# Patient Record
Sex: Male | Born: 1955
Health system: Southern US, Community
[De-identification: ages and names within clinical notes are randomized; demographics above are authoritative.]

## PROBLEM LIST (undated history)

## (undated) DIAGNOSIS — R7303 Prediabetes: Secondary | ICD-10-CM

## (undated) DIAGNOSIS — I10 Essential (primary) hypertension: Secondary | ICD-10-CM

## (undated) DIAGNOSIS — E119 Type 2 diabetes mellitus without complications: Secondary | ICD-10-CM

## (undated) DIAGNOSIS — N529 Male erectile dysfunction, unspecified: Secondary | ICD-10-CM

## (undated) DIAGNOSIS — E785 Hyperlipidemia, unspecified: Secondary | ICD-10-CM

## (undated) HISTORY — PX: APPENDECTOMY: SHX54

## (undated) HISTORY — PX: NASAL POLYP SURGERY: SHX186

## (undated) HISTORY — DX: Male erectile dysfunction, unspecified: N52.9

## (undated) HISTORY — DX: Essential (primary) hypertension: I10

## (undated) HISTORY — DX: Type 2 diabetes mellitus without complications: E11.9

## (undated) HISTORY — DX: Hyperlipidemia, unspecified: E78.5

## (undated) HISTORY — DX: Prediabetes: R73.03

---

## 2005-02-01 ENCOUNTER — Ambulatory Visit: Payer: Self-pay | Admitting: Internal Medicine

## 2005-02-25 ENCOUNTER — Ambulatory Visit: Payer: Self-pay | Admitting: Internal Medicine

## 2005-04-08 ENCOUNTER — Ambulatory Visit: Payer: Self-pay | Admitting: Internal Medicine

## 2005-06-08 ENCOUNTER — Ambulatory Visit: Payer: Self-pay | Admitting: Internal Medicine

## 2005-08-30 ENCOUNTER — Ambulatory Visit: Payer: Self-pay | Admitting: Internal Medicine

## 2005-09-27 ENCOUNTER — Ambulatory Visit: Payer: Self-pay | Admitting: Internal Medicine

## 2005-10-01 ENCOUNTER — Ambulatory Visit: Payer: Self-pay | Admitting: Internal Medicine

## 2005-10-28 ENCOUNTER — Ambulatory Visit: Payer: Self-pay | Admitting: Internal Medicine

## 2005-12-10 ENCOUNTER — Ambulatory Visit: Payer: Self-pay | Admitting: Internal Medicine

## 2005-12-21 ENCOUNTER — Ambulatory Visit: Payer: Self-pay | Admitting: Internal Medicine

## 2006-02-02 ENCOUNTER — Ambulatory Visit: Payer: Self-pay | Admitting: Internal Medicine

## 2006-02-02 LAB — CONVERTED CEMR LAB
CO2: 29 meq/L (ref 19–32)
Chol/HDL Ratio, serum: 3.9
Cholesterol: 175 mg/dL (ref 0–200)
Creatinine, Ser: 0.9 mg/dL (ref 0.4–1.5)
Glomerular Filtration Rate, Af Am: 116 mL/min/{1.73_m2}
Glucose, Bld: 109 mg/dL — ABNORMAL HIGH (ref 70–99)
Potassium: 4.3 meq/L (ref 3.5–5.1)
Triglyceride fasting, serum: 150 mg/dL — ABNORMAL HIGH (ref 0–149)

## 2006-02-03 ENCOUNTER — Ambulatory Visit: Payer: Self-pay | Admitting: Internal Medicine

## 2006-05-19 ENCOUNTER — Ambulatory Visit: Payer: Self-pay | Admitting: Internal Medicine

## 2006-09-01 ENCOUNTER — Ambulatory Visit: Payer: Self-pay | Admitting: Internal Medicine

## 2006-09-01 LAB — CONVERTED CEMR LAB
Albumin: 3.9 g/dL (ref 3.5–5.2)
Alkaline Phosphatase: 61 units/L (ref 39–117)
BUN: 14 mg/dL (ref 6–23)
Chloride: 107 meq/L (ref 96–112)
Creatinine, Ser: 0.8 mg/dL (ref 0.4–1.5)
GFR calc non Af Amer: 109 mL/min
LDL Cholesterol: 126 mg/dL — ABNORMAL HIGH (ref 0–99)
Potassium: 4.1 meq/L (ref 3.5–5.1)
Total Bilirubin: 0.7 mg/dL (ref 0.3–1.2)
Triglycerides: 159 mg/dL — ABNORMAL HIGH (ref 0–149)
VLDL: 32 mg/dL (ref 0–40)

## 2006-09-22 ENCOUNTER — Ambulatory Visit: Payer: Self-pay | Admitting: Internal Medicine

## 2006-10-01 DIAGNOSIS — J309 Allergic rhinitis, unspecified: Secondary | ICD-10-CM | POA: Insufficient documentation

## 2006-10-01 DIAGNOSIS — Z87891 Personal history of nicotine dependence: Secondary | ICD-10-CM | POA: Insufficient documentation

## 2006-10-01 DIAGNOSIS — E785 Hyperlipidemia, unspecified: Secondary | ICD-10-CM | POA: Insufficient documentation

## 2006-10-01 DIAGNOSIS — F17200 Nicotine dependence, unspecified, uncomplicated: Secondary | ICD-10-CM | POA: Insufficient documentation

## 2006-10-01 DIAGNOSIS — R7309 Other abnormal glucose: Secondary | ICD-10-CM

## 2006-12-08 ENCOUNTER — Encounter: Payer: Self-pay | Admitting: Internal Medicine

## 2006-12-08 ENCOUNTER — Ambulatory Visit: Payer: Self-pay | Admitting: Internal Medicine

## 2006-12-08 DIAGNOSIS — I10 Essential (primary) hypertension: Secondary | ICD-10-CM

## 2007-02-17 ENCOUNTER — Ambulatory Visit: Payer: Self-pay | Admitting: Internal Medicine

## 2007-02-17 LAB — CONVERTED CEMR LAB
ALT: 38 U/L
AST: 28 U/L
BUN: 12 mg/dL
CO2: 28 meq/L
Calcium: 9.2 mg/dL
Chloride: 103 meq/L
Cholesterol: 182 mg/dL
Creatinine, Ser: 0.8 mg/dL
GFR calc Af Amer: 132 mL/min
GFR calc non Af Amer: 109 mL/min
Glucose, Bld: 98 mg/dL
HDL: 44.6 mg/dL
Hgb A1c MFr Bld: 6.1 % — ABNORMAL HIGH
LDL Cholesterol: 98 mg/dL
Potassium: 3.9 meq/L
Sodium: 138 meq/L
Total CHOL/HDL Ratio: 4.1
Triglycerides: 197 mg/dL — ABNORMAL HIGH
VLDL: 39 mg/dL

## 2007-02-23 ENCOUNTER — Ambulatory Visit: Payer: Self-pay | Admitting: Internal Medicine

## 2007-02-23 DIAGNOSIS — F528 Other sexual dysfunction not due to a substance or known physiological condition: Secondary | ICD-10-CM | POA: Insufficient documentation

## 2007-02-23 DIAGNOSIS — N529 Male erectile dysfunction, unspecified: Secondary | ICD-10-CM | POA: Insufficient documentation

## 2007-06-09 ENCOUNTER — Ambulatory Visit: Payer: Self-pay | Admitting: Internal Medicine

## 2007-06-09 LAB — CONVERTED CEMR LAB
Cholesterol: 159 mg/dL (ref 0–200)
LDL Cholesterol: 96 mg/dL (ref 0–99)
Total CHOL/HDL Ratio: 3.6
VLDL: 19 mg/dL (ref 0–40)

## 2007-06-20 ENCOUNTER — Encounter: Payer: Self-pay | Admitting: Internal Medicine

## 2007-06-23 ENCOUNTER — Ambulatory Visit: Payer: Self-pay | Admitting: Internal Medicine

## 2007-07-28 ENCOUNTER — Ambulatory Visit: Payer: Self-pay | Admitting: Gastroenterology

## 2007-08-11 ENCOUNTER — Ambulatory Visit: Payer: Self-pay | Admitting: Gastroenterology

## 2007-08-11 HISTORY — PX: COLONOSCOPY: SHX174

## 2008-01-02 ENCOUNTER — Ambulatory Visit: Payer: Self-pay | Admitting: Internal Medicine

## 2008-01-02 DIAGNOSIS — K5730 Diverticulosis of large intestine without perforation or abscess without bleeding: Secondary | ICD-10-CM | POA: Insufficient documentation

## 2008-01-05 ENCOUNTER — Ambulatory Visit: Payer: Self-pay | Admitting: Internal Medicine

## 2008-01-05 LAB — CONVERTED CEMR LAB
BUN: 19 mg/dL (ref 6–23)
CO2: 27 meq/L (ref 19–32)
Calcium: 9 mg/dL (ref 8.4–10.5)
Cholesterol: 209 mg/dL (ref 0–200)
Direct LDL: 120.8 mg/dL
GFR calc Af Amer: 154 mL/min
Glucose, Bld: 94 mg/dL (ref 70–99)
HDL: 50.7 mg/dL (ref >39.0)
Sodium: 138 meq/L (ref 135–145)
TSH: 1.04 microintl units/mL (ref 0.35–5.50)
Triglycerides: 124 mg/dL (ref 0–149)

## 2008-06-28 ENCOUNTER — Ambulatory Visit: Payer: Self-pay | Admitting: Internal Medicine

## 2008-06-28 DIAGNOSIS — J069 Acute upper respiratory infection, unspecified: Secondary | ICD-10-CM | POA: Insufficient documentation

## 2008-07-12 ENCOUNTER — Ambulatory Visit: Payer: Self-pay | Admitting: Internal Medicine

## 2008-07-12 LAB — CONVERTED CEMR LAB
AST: 22 units/L (ref 0–37)
Alkaline Phosphatase: 59 units/L (ref 39–117)
Basophils Absolute: 0 10*3/uL (ref 0.0–0.1)
Bilirubin, Direct: 0.2 mg/dL (ref 0.0–0.3)
CO2: 27 meq/L (ref 19–32)
Calcium: 9.1 mg/dL (ref 8.4–10.5)
Eosinophils Relative: 3.6 % (ref 0.0–5.0)
GFR calc non Af Amer: 152.73 mL/min (ref >60)
Glucose, Bld: 92 mg/dL (ref 70–99)
HDL: 44.8 mg/dL (ref >39.00)
Hgb A1c MFr Bld: 6.3 % (ref 4.6–6.5)
Lymphocytes Relative: 44.2 % (ref 12.0–46.0)
Monocytes Relative: 9.5 % (ref 3.0–12.0)
PSA: 0.29 ng/mL (ref 0.10–4.00)
Platelets: 265 10*3/uL (ref 150.0–400.0)
Potassium: 3.8 meq/L (ref 3.5–5.1)
RDW: 13.2 % (ref 11.5–14.6)
Sodium: 142 meq/L (ref 135–145)
TSH: 0.83 microintl units/mL (ref 0.35–5.50)
Total CHOL/HDL Ratio: 4
Total Protein: 7 g/dL (ref 6.0–8.3)
VLDL: 17.8 mg/dL (ref 0.0–40.0)
WBC: 4.3 10*3/uL — ABNORMAL LOW (ref 4.5–10.5)

## 2008-07-14 ENCOUNTER — Encounter: Payer: Self-pay | Admitting: Internal Medicine

## 2008-08-13 ENCOUNTER — Encounter: Payer: Self-pay | Admitting: Internal Medicine

## 2008-09-16 ENCOUNTER — Ambulatory Visit: Payer: Self-pay | Admitting: Family Medicine

## 2008-09-16 ENCOUNTER — Ambulatory Visit: Payer: Self-pay | Admitting: Diagnostic Radiology

## 2008-09-16 ENCOUNTER — Ambulatory Visit (HOSPITAL_BASED_OUTPATIENT_CLINIC_OR_DEPARTMENT_OTHER): Admission: RE | Admit: 2008-09-16 | Discharge: 2008-09-16 | Payer: Self-pay | Admitting: Family Medicine

## 2008-09-16 DIAGNOSIS — R071 Chest pain on breathing: Secondary | ICD-10-CM

## 2008-12-27 ENCOUNTER — Ambulatory Visit: Payer: Self-pay | Admitting: Internal Medicine

## 2008-12-27 LAB — CONVERTED CEMR LAB
Hgb A1c MFr Bld: 5.9 % (ref 4.6–6.1)
Potassium: 4.7 meq/L (ref 3.5–5.3)
Sodium: 140 meq/L (ref 135–145)

## 2008-12-30 ENCOUNTER — Encounter: Payer: Self-pay | Admitting: Internal Medicine

## 2009-04-24 ENCOUNTER — Emergency Department (HOSPITAL_COMMUNITY): Admission: EM | Admit: 2009-04-24 | Discharge: 2009-04-24 | Payer: Self-pay | Admitting: Emergency Medicine

## 2009-06-06 ENCOUNTER — Encounter (INDEPENDENT_AMBULATORY_CARE_PROVIDER_SITE_OTHER): Payer: Self-pay | Admitting: *Deleted

## 2009-06-06 ENCOUNTER — Ambulatory Visit: Payer: Self-pay | Admitting: Internal Medicine

## 2009-06-06 DIAGNOSIS — T2009XA Burn of unspecified degree of multiple sites of head, face, and neck, initial encounter: Secondary | ICD-10-CM

## 2009-06-06 HISTORY — DX: Burn of unspecified degree of multiple sites of head, face, and neck, initial encounter: T20.09XA

## 2009-06-06 LAB — CONVERTED CEMR LAB
CO2: 24 meq/L (ref 19–32)
Calcium: 9.3 mg/dL (ref 8.4–10.5)
Chloride: 102 meq/L (ref 96–112)
Potassium: 4.1 meq/L (ref 3.5–5.3)
Sodium: 140 meq/L (ref 135–145)

## 2009-06-09 ENCOUNTER — Encounter: Payer: Self-pay | Admitting: Internal Medicine

## 2009-08-11 ENCOUNTER — Telehealth: Payer: Self-pay | Admitting: Internal Medicine

## 2010-02-24 ENCOUNTER — Ambulatory Visit: Payer: Self-pay | Admitting: Internal Medicine

## 2010-02-24 ENCOUNTER — Encounter: Payer: Self-pay | Admitting: Internal Medicine

## 2010-02-24 DIAGNOSIS — R350 Frequency of micturition: Secondary | ICD-10-CM

## 2010-02-24 LAB — CONVERTED CEMR LAB
ALT: 56 units/L — ABNORMAL HIGH (ref 0–53)
Bilirubin Urine: NEGATIVE
Bilirubin, Direct: 0.1 mg/dL (ref 0.0–0.3)
Chloride: 106 meq/L (ref 96–112)
Cholesterol: 185 mg/dL (ref 0–200)
Glucose, Bld: 97 mg/dL (ref 70–99)
Hgb A1c MFr Bld: 5.8 % — ABNORMAL HIGH (ref <5.7)
Ketones, urine, test strip: NEGATIVE
Potassium: 4.6 meq/L (ref 3.5–5.3)
Sodium: 142 meq/L (ref 135–145)
TSH: 1.006 microintl units/mL (ref 0.350–4.500)
Total CHOL/HDL Ratio: 3.5
Total Protein: 7.1 g/dL (ref 6.0–8.3)
Triglycerides: 430 mg/dL — ABNORMAL HIGH (ref <150)
Urobilinogen, UA: 0.2

## 2010-03-09 ENCOUNTER — Encounter: Payer: Self-pay | Admitting: Internal Medicine

## 2010-03-11 ENCOUNTER — Telehealth: Payer: Self-pay | Admitting: Internal Medicine

## 2010-04-09 NOTE — Progress Notes (Signed)
Summary: Micardis Refill  Phone Note Refill Request Message from:  Fax from Pharmacy on August 11, 2009 12:16 PM  Refills Requested: Medication #1:  MICARDIS 40 MG  TABS one by mouth once daily   Dosage confirmed as above?Dosage Confirmed   Brand Name Necessary? No   Supply Requested: 3 months   Last Refilled: 05/06/2009  Method Requested: Electronic Next Appointment Scheduled: None Initial call taken by: Glendell Docker CMA,  August 11, 2009 12:17 PM  Follow-up for Phone Call        Rx completed in Dr. Tiajuana Amass Follow-up by: Glendell Docker CMA,  August 11, 2009 12:17 PM    Prescriptions: MICARDIS 40 MG  TABS (TELMISARTAN) one by mouth once daily  #90 x 3   Entered by:   Glendell Docker CMA   Authorized by:   D. Thomos Lemons DO   Signed by:   Glendell Docker CMA on 08/11/2009   Method used:   [facility]        [facility] [phone]* (retail)       [address]       [place], [place]  [address]       Ph: [phone] or [phone]       Fax: [phone]   RxID:   [id number]
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
## 2010-04-09 NOTE — Letter (Signed)
Summary: Primary Care Consult Scheduled Letter  [place] at [facility]  [address]   [place], [place] [address]   Phone: [phone]  Fax: [phone]      06/06/2009 MRN: [id number]  Drew Harris [address] [place], [place]  [address]    Dear [name],      We have scheduled an appointment for you.  At the recommendation of Dr._YOO, we have scheduled you a consult with _DR Donzetta Starch @ [facility]  on _MAY 23,2011 at [facility] .  Their address is_[address] , [place] . The office phone number is [phone].  If this appointment day and time is not convenient for you, please feel free to call the office of the doctor you are being referred to at the number listed above and reschedule the appointment.     It is important for you to keep your scheduled appointments. We are here to make sure you are given good patient care. If you have questions or you have made changes to your appointment, please notify us at  [phone], ask for  HELEN.    Thank you,  Patient Care Coordinator [place] at [facility]

## 2010-04-09 NOTE — Letter (Signed)
   [place] at [facility] [address] [place], [place]  [address]  [place] Phone: [phone]      March 09, 2010   Ragnar Sabree [address] [name], [place] [address]  RE:  LAB RESULTS  Dear  Mr. Kozub,  The following is an interpretation of your most recent lab tests.  Please take note of any instructions provided or changes to medications that have resulted from your lab work.  ELECTROLYTES:  Good - no changes needed  KIDNEY FUNCTION TESTS:  Good - no changes needed  LIVER FUNCTION TESTS:  Stable - no changes needed  LIPID PANEL:  Fair - review at your next visit Triglyceride: 430   Cholesterol: 185   LDL: See Comment mg/dL   HDL: 53   Chol/HDL%:  3.5 Ratio  THYROID STUDIES:  Thyroid studies normal TSH: 1.006     DIABETIC STUDIES:  Good - no changes needed Blood Glucose: 97   HgbA1C: 5.8          Sincerely Yours,    Dr. Thomos Lemons  Appended Document:  mailed

## 2010-04-09 NOTE — Progress Notes (Signed)
Summary: refill--benicar  Phone Note From Pharmacy   [facility] [phone]* Call For: Dr Artist Pais  Summary of Call: [facility] told them he gave them his written rx for benicar but the do not have it. Authorized Benicar 20mg  1 once daily #90 x 1 refill per 02/24/10 office note. Nicki Guadalajara Fergerson CMA (AAMA)  March 11, 2010 2:02 PM     Prescriptions: BENICAR 20 MG TABS (OLMESARTAN MEDOXOMIL) one by mouth once daily  #90 x 1   Entered by:   Mervin Kung CMA (AAMA)   Authorized by:   D. Thomos Lemons DO   Signed by:   Mervin Kung CMA (AAMA) on 03/11/2010   Method used:   Telephoned to ...       [facility] [place] [phone]* (retail)       [phone]       [place], [place]  [address]       Ph: [phone] or [phone]       Fax: [phone]   RxID:   [id number]

## 2010-04-09 NOTE — Assessment & Plan Note (Signed)
Summary: 6 MONTH FOLLOW UP/MHF   Vital Signs:  Patient profile:   55 year old male Height:      69 inches Weight:      230.25 pounds BMI:     34.12 O2 Sat:      99 % on Room air Temp:     98.3 degrees F oral Pulse rate:   93 / minute Resp:     18 per minute BP sitting:   130 / 80  (right arm) Cuff size:   large  Vitals Entered By: Glendell Docker CMA (February 24, 2010 10:09 AM)  O2 Flow:  Room air CC: 6 Month Follow up Is Patient Diabetic? No Pain Assessment Patient in pain? no      Comments urinary frequency    Primary Care Provider:  Dondra Spry DO  CC:  6 Month Follow up.  History of Present Illness: 55 y/o AA male for f/u re:  hypertension and borderline DM II doing well c/o urinary freq.  no other assoc symptoms  htn - stable.  no dizziness  DM II borderline - wt stable.  not exercising on regular basis  Preventive Screening-Counseling & Management  Alcohol-Tobacco     Smoking Status: current  Allergies: 1)  ! Ace Inhibitors  Past History:  Past Medical History: Allergic rhinitis Hyperlipidemia  Borderline type 2 diabetes    Hypertension Erectile Dysfunction    Past Surgical History: Appendectomy        Family History: Mother are 16 years old with hypertension Father deceased age 75 secondary to prostate cancer Sister has type 2 diabetes       Social History: Quit Tobacco Single  Alcohol use-yes     Occupation:  [employer]    Review of Systems  The patient denies fever and weight gain.         not exercising on regular basis   Physical Exam  General:  alert and overweight-appearing.   Head:  normocephalic and atraumatic.   Neck:  no carotid bruits.   Lungs:  normal respiratory effort, normal breath sounds, no crackles, and no wheezes.   Heart:  normal rate, regular rhythm, no murmur, and no gallop.   Abdomen:  soft, non-tender, no masses, no hepatomegaly, and no splenomegaly.   Extremities:  trace left pedal edema  and trace right pedal edema.   Neurologic:  cranial nerves II-XII intact and gait normal.   Psych:  normally interactive, good eye contact, not anxious appearing, and not depressed appearing.     Impression & Recommendations:  Problem # 1:  URINARY FREQUENCY (ICD-788.41) no dysuria no back pain or fever  UA shows trace blood  Increase fluids. repeat u/a in March pt advised to call office if persistent urinary complaints  Problem # 2:  ESSENTIAL HYPERTENSION (ICD-401.9)  His updated medication list for this problem includes:    Benicar 20 Mg Tabs (Olmesartan medoxomil) ..... One by mouth once daily    Amlodipine Besylate 10 Mg Tabs (Amlodipine besylate) .Marland Kitchen... Take 1 tablet by mouth once a day  Orders: T-Basic Metabolic Panel [id number])  Problem # 3:  ABNORMAL GLUCOSE NEC (ICD-790.29) pt stopped exercising regularly.  monitor A1c.  Pt strongly encouraged to resume regular exercise  His updated medication list for this problem includes:    Metformin Hcl 500 Mg Xr24h-tab (Metformin hcl) .Marland Kitchen... 2 tabs by mouth bid  Orders: T- Hemoglobin A1C ([id number])  Complete Medication List: 1)  Benicar 20 Mg Tabs (Olmesartan medoxomil) .... One  by mouth once daily 2)  Metformin Hcl 500 Mg Xr24h-tab (Metformin hcl) .... 2 tabs by mouth bid 3)  Levitra 10 Mg Tabs (Vardenafil hcl) .... By mouth as needed 4)  Amlodipine Besylate 10 Mg Tabs (Amlodipine besylate) .... Take 1 tablet by mouth once a day 5)  Simvastatin 20 Mg Tabs (Simvastatin) .... One by mouth qpm 6)  Cialis 5 Mg Tabs (Tadalafil) .... One by mouth once daily  Other Orders: T-Hepatic Function [id number]) T-Lipid Profile [id number]) T-TSH [id number]) UA Dipstick w/o Micro (manual) (62952) Specimen Handling (99000) T-Culture, Urine ([id number])  Patient Instructions: 1)  Please schedule a follow-up appointment in 6 months. 2)  Resume regular exercise program 3)  Goal weight loss 10-15 lbs before next office  visit. 4)  Return in March, 2012 to drop off urine sample. Prescriptions: METFORMIN HCL 500 MG XR24H-TAB (METFORMIN HCL) 2 tabs by mouth bid  #360 x 1   Entered and Authorized by:   D. Thomos Lemons DO   Signed by:   D. Thomos Lemons DO on 02/24/2010   Method used:   Print then Give to Patient   RxID:   [id number] SIMVASTATIN 20 MG TABS (SIMVASTATIN) one by mouth qpm  #90 x 3   Entered and Authorized by:   D. Thomos Lemons DO   Signed by:   D. Thomos Lemons DO on 02/24/2010   Method used:   Print then Give to Patient   RxID:   [id number] AMLODIPINE BESYLATE 10 MG  TABS (AMLODIPINE BESYLATE) Take 1 tablet by mouth once a day  #90 x 3   Entered and Authorized by:   D. Thomos Lemons DO   Signed by:   D. Thomos Lemons DO on 02/24/2010   Method used:   Print then Give to Patient   RxID:   [id number] BENICAR 20 MG TABS (OLMESARTAN MEDOXOMIL) one by mouth once daily  #90 x 1   Entered and Authorized by:   D. Thomos Lemons DO   Signed by:   D. Thomos Lemons DO on 02/24/2010   Method used:   Print then Give to Patient   RxID:   [id number] CIALIS 5 MG TABS (TADALAFIL) one by mouth once daily  #30 x 0   Entered and Authorized by:   D. Thomos Lemons DO   Signed by:   D. Thomos Lemons DO on 02/24/2010   Method used:   Print then Give to Patient   RxID:   [id number]    Orders Added: 1)  T-Basic Metabolic Panel [[id number]] 2)  T- Hemoglobin A1C [83036-23375] 3)  T-Hepatic Function [80076-22960] 4)  T-Lipid Profile [80061-22930] 5)  T-TSH [37106-26948] 6)  UA Dipstick w/o Micro (manual) [81002] 7)  Specimen Handling [99000] 8)  T-Culture, Urine [[id number]] 9)  Est. Patient Level IV [93818]   Immunization History:  Influenza Immunization History:    Influenza:  declined (02/24/2010)   Contraindications/Deferment of Procedures/Staging:    Test/Procedure: FLU VAX    Reason for deferment: patient declined   Immunization History:  Influenza Immunization History:    Influenza:   Declined (02/24/2010)   Current Allergies (reviewed today): ! ACE INHIBITORS   Laboratory Results   Urine Tests   Date/Time Reported: Mervin Kung CMA Duncan Dull)  February 24, 2010 10:42 AM   Routine Urinalysis   Color: yellow Appearance: Clear Glucose: negative   (Normal Range: Negative) Bilirubin: negative   (Normal Range: Negative) Ketone: negative   (Normal Range: Negative) Spec. Gravity: 1.010   (Normal Range: 1.003-1.035)  Blood: trace-intact   (Normal Range: Negative) pH: 7.0   (Normal Range: 5.0-8.0) Protein: trace   (Normal Range: Negative) Urobilinogen: 0.2   (Normal Range: 0-1) Nitrite: negative   (Normal Range: Negative) Leukocyte Esterace: negative   (Normal Range: Negative)    Comments: Urine cultured. Nicki Guadalajara Fergerson CMA [facility] Dull)  February 24, 2010 10:42 AM

## 2010-04-09 NOTE — Letter (Signed)
   [place] at [facility] [address] [place], [place]  [address]  [place] Phone: [phone]      June 09, 2009   Drew Harris [address] [place], [place] [address]  RE:  LAB RESULTS  Dear  [name],  The following is an interpretation of your most recent lab tests.  Please take note of any instructions provided or changes to medications that have resulted from your lab work.  ELECTROLYTES:  Good - no changes needed    DIABETIC STUDIES:  Good - no changes needed Blood Glucose: 104   HgbA1C: 5.9          Sincerely Yours,    Dr. Thomos Lemons

## 2010-05-19 ENCOUNTER — Ambulatory Visit (INDEPENDENT_AMBULATORY_CARE_PROVIDER_SITE_OTHER): Payer: Managed Care, Other (non HMO) | Admitting: Internal Medicine

## 2010-05-19 ENCOUNTER — Encounter: Payer: Self-pay | Admitting: Internal Medicine

## 2010-05-19 DIAGNOSIS — R7309 Other abnormal glucose: Secondary | ICD-10-CM

## 2010-05-19 DIAGNOSIS — I10 Essential (primary) hypertension: Secondary | ICD-10-CM

## 2010-05-22 ENCOUNTER — Ambulatory Visit: Payer: Self-pay | Admitting: Internal Medicine

## 2010-06-09 NOTE — Assessment & Plan Note (Signed)
Summary: 3 month follow up/mhf   Vital Signs:  Patient profile:   55 year old male Height:      69 inches Weight:      231.75 pounds BMI:     34.35 O2 Sat:      99 % on Room air Temp:     98.0 degrees F oral Pulse rate:   99 / minute Resp:     18 per minute BP sitting:   130 / 90  (left arm) Cuff size:   large  Vitals Entered By: Glendell Docker CMA (May 19, 2010 3:18 PM)  O2 Flow:  Room air CC: 3 month Follwo up  Is Patient Diabetic? Yes Pain Assessment Patient in pain? no        Primary Care Provider:  Dondra Spry DO  CC:  3 month Follwo up .  History of Present Illness:  Hypertension Follow-Up      This is a 55 year old man who presents for Hypertension follow-up.  The patient denies lightheadedness and headaches.  The patient denies the following associated symptoms: chest pain and chest pressure.  Compliance with medications (by patient report) has been near 100%.  The patient reports that dietary compliance has been fair.    DM II borderline - wt stable.  fair dietary compliance  Preventive Screening-Counseling & Management  Alcohol-Tobacco     Smoking Status: current  Allergies: 1)  ! Ace Inhibitors  Past History:  Past Medical History: Allergic rhinitis Hyperlipidemia  Borderline type 2 diabetes    Hypertension Erectile Dysfunction     Past Surgical History: Appendectomy         Family History: Mother are 3 years old with hypertension Father deceased age 58 secondary to prostate cancer Sister has type 2 diabetes        Social History: Quit Tobacco Single   Alcohol use-yes     Occupation:  [employer]    Physical Exam  General:  alert, well-developed, and well-nourished.   Neck:  no carotid bruits.   Lungs:  normal respiratory effort, normal breath sounds, no crackles, and no wheezes.   Heart:  normal rate, regular rhythm, no murmur, and no gallop.   Extremities:  trace left pedal edema and trace right pedal edema.      Impression & Recommendations:  Problem # 1:  ESSENTIAL HYPERTENSION (ICD-401.9) Assessment Unchanged  His updated medication list for this problem includes:    Benicar 20 Mg Tabs (Olmesartan medoxomil) ..... One by mouth once daily    Amlodipine Besylate 10 Mg Tabs (Amlodipine besylate) .Marland Kitchen... Take 1 tablet by mouth once a day  Orders: T-Basic Metabolic Panel ([id number])  BP today: 130/90 Prior BP: 130/80 (02/24/2010)  Labs Reviewed: K+: 4.6 (02/24/2010) Creat: : 0.95 (02/24/2010)   Chol: 185 (02/24/2010)   HDL: 53 (02/24/2010)   LDL: See Comment mg/dL (78/29/5621)   TG: 308 (02/24/2010)  Problem # 2:  ABNORMAL GLUCOSE NEC (ICD-790.29) Assessment: Unchanged  His updated medication list for this problem includes:    Metformin Hcl 500 Mg Xr24h-tab (Metformin hcl) .Marland Kitchen... 2 tabs by mouth bid  Orders: T- Hemoglobin A1C ([id number])  Labs Reviewed: Creat: 0.95 (02/24/2010)     Complete Medication List: 1)  Benicar 20 Mg Tabs (Olmesartan medoxomil) .... One by mouth once daily 2)  Metformin Hcl 500 Mg Xr24h-tab (Metformin hcl) .... 2 tabs by mouth bid 3)  Levitra 10 Mg Tabs (Vardenafil hcl) .... By mouth as needed 4)  Amlodipine Besylate 10  Mg Tabs (Amlodipine besylate) .... Take 1 tablet by mouth once a day 5)  Simvastatin 20 Mg Tabs (Simvastatin) .... One by mouth qpm 6)  Cialis 5 Mg Tabs (Tadalafil) .... One by mouth once daily  Other Orders: T-Lipid Profile ([id number]) T-Hepatic Function [id number])  Patient Instructions: 1)  Please schedule a follow-up appointment in 6 months (30 Mins appt) 2)  BMP prior to visit, ICD-9: 401.9 3)  PSA prior to visit, ICD-9: v76.44 4)  HbgA1C prior to visit, ICD-9: 790.29 5)  Please return for lab work one (1) week before your next appointment.    Orders Added: 1)  T-Lipid Profile [80061-22930] 2)  T- Hemoglobin A1C [83036-23375] 3)  T-Hepatic Function [80076-22960] 4)  T-Basic Metabolic Panel [80048-22910] 5)  Est.  Patient Level III [78469]     Current Allergies (reviewed today): ! ACE INHIBITORS

## 2010-06-19 ENCOUNTER — Other Ambulatory Visit (INDEPENDENT_AMBULATORY_CARE_PROVIDER_SITE_OTHER): Payer: Managed Care, Other (non HMO)

## 2010-06-19 DIAGNOSIS — E785 Hyperlipidemia, unspecified: Secondary | ICD-10-CM

## 2010-06-19 DIAGNOSIS — R7309 Other abnormal glucose: Secondary | ICD-10-CM

## 2010-06-19 LAB — HEPATIC FUNCTION PANEL
ALT: 38 U/L (ref 0–53)
AST: 32 U/L (ref 0–37)
Bilirubin, Direct: 0.1 mg/dL (ref 0.0–0.3)
Total Bilirubin: 1 mg/dL (ref 0.3–1.2)

## 2010-06-19 LAB — BASIC METABOLIC PANEL
CO2: 27 mEq/L (ref 19–32)
Calcium: 8.9 mg/dL (ref 8.4–10.5)
Creatinine, Ser: 0.7 mg/dL (ref 0.4–1.5)
Glucose, Bld: 88 mg/dL (ref 70–99)

## 2010-06-22 ENCOUNTER — Encounter: Payer: Self-pay | Admitting: Internal Medicine

## 2010-10-30 ENCOUNTER — Other Ambulatory Visit: Payer: Self-pay | Admitting: Internal Medicine

## 2010-10-30 DIAGNOSIS — R7309 Other abnormal glucose: Secondary | ICD-10-CM

## 2010-10-30 DIAGNOSIS — I10 Essential (primary) hypertension: Secondary | ICD-10-CM

## 2010-10-30 DIAGNOSIS — Z125 Encounter for screening for malignant neoplasm of prostate: Secondary | ICD-10-CM

## 2010-11-02 ENCOUNTER — Other Ambulatory Visit: Payer: Managed Care, Other (non HMO)

## 2010-11-12 ENCOUNTER — Encounter: Payer: Self-pay | Admitting: Internal Medicine

## 2010-11-13 ENCOUNTER — Ambulatory Visit: Payer: Managed Care, Other (non HMO) | Admitting: Internal Medicine

## 2010-11-13 ENCOUNTER — Encounter: Payer: Self-pay | Admitting: Internal Medicine

## 2010-11-13 ENCOUNTER — Other Ambulatory Visit: Payer: Self-pay | Admitting: Internal Medicine

## 2010-11-13 ENCOUNTER — Ambulatory Visit (INDEPENDENT_AMBULATORY_CARE_PROVIDER_SITE_OTHER): Payer: Managed Care, Other (non HMO) | Admitting: Internal Medicine

## 2010-11-13 DIAGNOSIS — I10 Essential (primary) hypertension: Secondary | ICD-10-CM

## 2010-11-13 DIAGNOSIS — E119 Type 2 diabetes mellitus without complications: Secondary | ICD-10-CM

## 2010-11-13 DIAGNOSIS — R350 Frequency of micturition: Secondary | ICD-10-CM

## 2010-11-13 DIAGNOSIS — Z125 Encounter for screening for malignant neoplasm of prostate: Secondary | ICD-10-CM

## 2010-11-13 DIAGNOSIS — E118 Type 2 diabetes mellitus with unspecified complications: Secondary | ICD-10-CM | POA: Insufficient documentation

## 2010-11-13 DIAGNOSIS — E785 Hyperlipidemia, unspecified: Secondary | ICD-10-CM

## 2010-11-13 LAB — URINALYSIS, ROUTINE W REFLEX MICROSCOPIC
Bilirubin Urine: NEGATIVE
Glucose, UA: NEGATIVE mg/dL
Protein, ur: NEGATIVE mg/dL
pH: 6 (ref 5.0–8.0)

## 2010-11-13 LAB — HEPATIC FUNCTION PANEL
ALT: 22 U/L (ref 0–53)
Bilirubin, Direct: 0.2 mg/dL (ref 0.0–0.3)
Indirect Bilirubin: 0.6 mg/dL (ref 0.0–0.9)
Total Bilirubin: 0.8 mg/dL (ref 0.3–1.2)

## 2010-11-13 LAB — BASIC METABOLIC PANEL
CO2: 25 mEq/L (ref 19–32)
Calcium: 8.9 mg/dL (ref 8.4–10.5)
Creat: 0.62 mg/dL (ref 0.50–1.35)

## 2010-11-13 LAB — CBC
MCH: 32.5 pg (ref 26.0–34.0)
MCV: 98.8 fL (ref 78.0–100.0)
Platelets: 263 10*3/uL (ref 150–400)
RDW: 13.7 % (ref 11.5–15.5)

## 2010-11-13 LAB — HEMOGLOBIN A1C: Mean Plasma Glucose: 123 mg/dL — ABNORMAL HIGH (ref <117)

## 2010-11-13 LAB — LIPID PANEL
Cholesterol: 195 mg/dL (ref 0–200)
VLDL: 33 mg/dL (ref 0–40)

## 2010-11-13 MED ORDER — METFORMIN HCL ER 500 MG PO TB24: 1000.0000 mg | ORAL_TABLET | Freq: Two times a day (BID) | ORAL | Status: DC

## 2010-11-13 MED ORDER — OLMESARTAN MEDOXOMIL 20 MG PO TABS: 20.0000 mg | ORAL_TABLET | Freq: Every day | ORAL | Status: DC

## 2010-11-13 MED ORDER — TADALAFIL 5 MG PO TABS: 5.0000 mg | ORAL_TABLET | Freq: Every day | ORAL | Status: DC | PRN

## 2010-11-13 MED ORDER — SIMVASTATIN 20 MG PO TABS: 20.0000 mg | ORAL_TABLET | Freq: Every day | ORAL | Status: DC

## 2010-11-13 MED ORDER — CIPROFLOXACIN HCL 500 MG PO TABS: 500.0000 mg | ORAL_TABLET | Freq: Two times a day (BID) | ORAL | Status: AC

## 2010-11-13 MED ORDER — AMLODIPINE BESYLATE 10 MG PO TABS: 10.0000 mg | ORAL_TABLET | Freq: Every day | ORAL | Status: DC

## 2010-11-13 NOTE — Assessment & Plan Note (Signed)
Obtain cbc, chem7, a1c 

## 2010-11-13 NOTE — Assessment & Plan Note (Signed)
Borderline results today but hasn't taken medication. Take medication daily, monitor bp and followup in clinic as scheduled.

## 2010-11-13 NOTE — Assessment & Plan Note (Signed)
Obtain lipid/lft. ldl goal <100. 

## 2010-11-13 NOTE — Progress Notes (Signed)
  Subjective:    Patient ID: Drew Harris, male    DOB: 15-Feb-1956, 55 y.o.   MRN: [id number]  HPI Pt presents to clinic for followup of multiple medical problems. Notes recent onset right groin pain with urinary urgency and frequency. Denies hematuria, dysuria or f/c. No exacerbating or alleviating factors. BP rechecked to be 138/70 and states hasn't taken medication today. Tolerates statin tx without myalgias or abn lfts. H/o mild dm tolerating metformin without gi upset. No other complaints.  Past Medical History  Diagnosis Date  . Allergic rhinitis   . Hyperlipidemia   . Borderline diabetes     Type 2  . Hypertension   . ED (erectile dysfunction)    Past Surgical History  Procedure Date  . Appendectomy     reports that he has quit smoking. He has never used smokeless tobacco. He reports that he drinks alcohol. He reports that he does not use illicit drugs. family history includes Diabetes type II in his sister; Hypertension in his mother; and Prostate cancer in his father. Allergies  Allergen Reactions  . Ace Inhibitors      Review of Systems see hpi    Objective:   Physical Exam  Physical Exam  Nursing note and vitals reviewed. Constitutional: Appears well-developed and well-nourished. No distress.  HENT:  Head: Normocephalic and atraumatic.  Right Ear: External ear normal.  Left Ear: External ear normal.  Eyes: Conjunctivae are normal. No scleral icterus.  Neck: Neck supple. Carotid bruit is not present.  Cardiovascular: Normal rate, regular rhythm and normal heart sounds.  Exam reveals no gallop and no friction rub.   No murmur heard. Pulmonary/Chest: Effort normal and breath sounds normal. No respiratory distress. He has no wheezes. no rales.  Lymphadenopathy:    He has no cervical adenopathy.  Neurological:Alert.  Skin: Skin is warm and dry. Not diaphoretic.  Psychiatric: Has a normal mood and affect.        Assessment & Plan:

## 2010-11-13 NOTE — Assessment & Plan Note (Signed)
Obtain ua. Begin cipro x 10d. Followup if no improvement or worsening.

## 2010-11-13 NOTE — Patient Instructions (Signed)
Please schedule chem7, a1c, urine microalbumin 250.0 prior to next visit 

## 2010-11-19 LAB — PSA: PSA: 0.33 ng/mL (ref <=4.00)

## 2011-02-12 ENCOUNTER — Ambulatory Visit (INDEPENDENT_AMBULATORY_CARE_PROVIDER_SITE_OTHER): Payer: Managed Care, Other (non HMO) | Admitting: Internal Medicine

## 2011-02-12 ENCOUNTER — Telehealth: Payer: Self-pay | Admitting: Internal Medicine

## 2011-02-12 ENCOUNTER — Encounter: Payer: Self-pay | Admitting: Internal Medicine

## 2011-02-12 VITALS — BP 132/90 | HR 89 | Temp 97.7°F | Resp 16 | Wt 235.0 lb

## 2011-02-12 DIAGNOSIS — Z125 Encounter for screening for malignant neoplasm of prostate: Secondary | ICD-10-CM

## 2011-02-12 DIAGNOSIS — E119 Type 2 diabetes mellitus without complications: Secondary | ICD-10-CM

## 2011-02-12 DIAGNOSIS — Z23 Encounter for immunization: Secondary | ICD-10-CM

## 2011-02-12 DIAGNOSIS — E785 Hyperlipidemia, unspecified: Secondary | ICD-10-CM

## 2011-02-12 DIAGNOSIS — I10 Essential (primary) hypertension: Secondary | ICD-10-CM

## 2011-02-12 LAB — BASIC METABOLIC PANEL
BUN: 14 mg/dL (ref 6–23)
Calcium: 9.1 mg/dL (ref 8.4–10.5)
Chloride: 103 mEq/L (ref 96–112)
Creat: 0.68 mg/dL (ref 0.50–1.35)

## 2011-02-12 LAB — LIPID PANEL
HDL: 56 mg/dL (ref >39)
LDL Cholesterol: 79 mg/dL (ref 0–99)
Total CHOL/HDL Ratio: 3.4 Ratio
Triglycerides: 281 mg/dL — ABNORMAL HIGH (ref <150)
VLDL: 56 mg/dL — ABNORMAL HIGH (ref 0–40)

## 2011-02-12 LAB — HEMOGLOBIN A1C: Mean Plasma Glucose: 120 mg/dL — ABNORMAL HIGH (ref <117)

## 2011-02-12 MED ORDER — METFORMIN HCL ER 500 MG PO TB24: 1000.0000 mg | ORAL_TABLET | Freq: Two times a day (BID) | ORAL | Status: DC

## 2011-02-12 NOTE — Assessment & Plan Note (Signed)
Obtain lipid profile. 

## 2011-02-12 NOTE — Assessment & Plan Note (Signed)
Minimally suboptimal. Defers increase of medication currently. States has resonded in the past to regular exercise.

## 2011-02-12 NOTE — Patient Instructions (Signed)
Please schedule chem7, a1c (250.0) and lipid/lft (272.4) prior to next visit 

## 2011-02-12 NOTE — Assessment & Plan Note (Signed)
Obtain chem7, a1c, urine microalbumin. Has scheduled yearly eye exam.

## 2011-02-12 NOTE — Progress Notes (Signed)
  Subjective:    Patient ID: Drew Harris, male    DOB: 05/07/1955, 55 y.o.   MRN: [id number]  HPI Pt presents to clinic for followup of multiple medical problems. BP minimally elevated. Compliant with medication without adverse effect. Diabetic eye exam pending. Denies feet parethesia. No active complaints.  Past Medical History  Diagnosis Date  . Allergic rhinitis   . Hyperlipidemia   . Borderline diabetes     Type 2  . Hypertension   . ED (erectile dysfunction)    Past Surgical History  Procedure Date  . Appendectomy     reports that he has quit smoking. He has never used smokeless tobacco. He reports that he drinks alcohol. He reports that he does not use illicit drugs. family history includes Diabetes type II in his sister; Hypertension in his mother; and Prostate cancer in his father. Allergies  Allergen Reactions  . Ace Inhibitors      Review of Systems see hpi     Objective:   Physical Exam  Physical Exam  Nursing note and vitals reviewed. Constitutional: Appears well-developed and well-nourished. No distress.  HENT:  Head: Normocephalic and atraumatic.  Right Ear: External ear normal.  Left Ear: External ear normal.  Eyes: Conjunctivae are normal. No scleral icterus.  Neck: Neck supple. Carotid bruit is not present.  Cardiovascular: Normal rate, regular rhythm and normal heart sounds.  Exam reveals no gallop and no friction rub.   No murmur heard. Pulmonary/Chest: Effort normal and breath sounds normal. No respiratory distress. He has no wheezes. no rales.  Lymphadenopathy:    He has no cervical adenopathy.  Neurological:Alert.  Skin: Skin is warm and dry. Not diaphoretic.  Psychiatric: Has a normal mood and affect.   Diabetic foot exam: +2 DP pulses, no diabetic wounds, ulcerations or significant callousing. Monofilament exam nl.     Assessment & Plan:

## 2011-02-13 LAB — PSA: PSA: 0.29 ng/mL (ref <=4.00)

## 2011-02-13 LAB — MICROALBUMIN / CREATININE URINE RATIO: Microalb, Ur: 8.9 mg/dL — ABNORMAL HIGH (ref 0.00–1.89)

## 2011-02-24 ENCOUNTER — Other Ambulatory Visit: Payer: Self-pay | Admitting: *Deleted

## 2011-02-24 MED ORDER — OLMESARTAN MEDOXOMIL 40 MG PO TABS: 40.0000 mg | ORAL_TABLET | Freq: Every day | ORAL | Status: DC

## 2011-02-24 NOTE — Telephone Encounter (Signed)
See lab note 02/12/2011. Patient advised to increase Benicar to 40 mg due to too much protein in urine.

## 2011-03-20 ENCOUNTER — Other Ambulatory Visit: Payer: Self-pay | Admitting: Internal Medicine

## 2011-03-22 NOTE — Telephone Encounter (Signed)
Rx refill sent to pharmacy. 

## 2011-06-10 ENCOUNTER — Other Ambulatory Visit: Payer: Self-pay | Admitting: Internal Medicine

## 2011-06-10 ENCOUNTER — Other Ambulatory Visit: Payer: Self-pay | Admitting: *Deleted

## 2011-06-10 DIAGNOSIS — Z125 Encounter for screening for malignant neoplasm of prostate: Secondary | ICD-10-CM

## 2011-06-10 DIAGNOSIS — E785 Hyperlipidemia, unspecified: Secondary | ICD-10-CM

## 2011-06-10 DIAGNOSIS — I10 Essential (primary) hypertension: Secondary | ICD-10-CM

## 2011-06-10 DIAGNOSIS — R7309 Other abnormal glucose: Secondary | ICD-10-CM

## 2011-06-11 LAB — HEMOGLOBIN A1C
Hgb A1c MFr Bld: 6 % — ABNORMAL HIGH (ref <5.7)
Mean Plasma Glucose: 126 mg/dL — ABNORMAL HIGH (ref <117)

## 2011-06-11 LAB — BASIC METABOLIC PANEL
BUN: 13 mg/dL (ref 6–23)
Chloride: 103 mEq/L (ref 96–112)
Glucose, Bld: 93 mg/dL (ref 70–99)
Potassium: 4.1 mEq/L (ref 3.5–5.3)
Sodium: 138 mEq/L (ref 135–145)

## 2011-06-11 LAB — HEPATIC FUNCTION PANEL
Alkaline Phosphatase: 62 U/L (ref 39–117)
Bilirubin, Direct: 0.2 mg/dL (ref 0.0–0.3)
Indirect Bilirubin: 0.4 mg/dL (ref 0.0–0.9)
Total Protein: 6.8 g/dL (ref 6.0–8.3)

## 2011-06-11 LAB — LIPID PANEL
HDL: 59 mg/dL (ref >39)
LDL Cholesterol: 69 mg/dL (ref 0–99)
Triglycerides: 118 mg/dL (ref <150)
VLDL: 24 mg/dL (ref 0–40)

## 2011-06-18 ENCOUNTER — Encounter: Payer: Self-pay | Admitting: Internal Medicine

## 2011-06-18 ENCOUNTER — Telehealth: Payer: Self-pay | Admitting: Internal Medicine

## 2011-06-18 ENCOUNTER — Ambulatory Visit (INDEPENDENT_AMBULATORY_CARE_PROVIDER_SITE_OTHER): Payer: Managed Care, Other (non HMO) | Admitting: Internal Medicine

## 2011-06-18 VITALS — BP 126/80 | HR 93 | Temp 97.8°F | Resp 18 | Ht 69.0 in | Wt 229.0 lb

## 2011-06-18 DIAGNOSIS — M549 Dorsalgia, unspecified: Secondary | ICD-10-CM

## 2011-06-18 DIAGNOSIS — I10 Essential (primary) hypertension: Secondary | ICD-10-CM

## 2011-06-18 DIAGNOSIS — E119 Type 2 diabetes mellitus without complications: Secondary | ICD-10-CM

## 2011-06-18 MED ORDER — OLMESARTAN MEDOXOMIL 40 MG PO TABS: 40.0000 mg | ORAL_TABLET | Freq: Every day | ORAL | Status: DC

## 2011-06-18 MED ORDER — DICLOFENAC SODIUM 75 MG PO TBEC: DELAYED_RELEASE_TABLET | ORAL | Status: DC

## 2011-06-18 MED ORDER — AMLODIPINE BESYLATE 10 MG PO TABS: 10.0000 mg | ORAL_TABLET | Freq: Every day | ORAL | Status: DC

## 2011-06-18 MED ORDER — METFORMIN HCL ER 500 MG PO TB24: 1000.0000 mg | ORAL_TABLET | Freq: Two times a day (BID) | ORAL | Status: DC

## 2011-06-18 NOTE — Telephone Encounter (Signed)
Lab orders entered for August 2013. 

## 2011-06-18 NOTE — Assessment & Plan Note (Signed)
Attempt short course of voltaren with food and no other nsaids. Followup if no improvement or worsening.

## 2011-06-18 NOTE — Assessment & Plan Note (Signed)
Normotensive and stable. Continue current regimen. Monitor bp as outpt and followup in clinic as scheduled.  

## 2011-06-18 NOTE — Progress Notes (Signed)
  Subjective:    Patient ID: Drew Harris, male    DOB: 1955-04-21, 55 y.o.   MRN: [id number]  HPI Pt presents to clinic for followup of multiple medical problems. Notes recent left lbp without radiation down leg, injury, paresthesia or weakness. Sometimes seems to radiate to left groin but no urinary sx's or bulging in the groin. Pain is positional. Taking no medication for the problem. No other alleviating or exacerbating factors. BP reviewed normotensive.  Past Medical History  Diagnosis Date  . Allergic rhinitis   . Hyperlipidemia   . Borderline diabetes     Type 2  . Hypertension   . ED (erectile dysfunction)    Past Surgical History  Procedure Date  . Appendectomy     reports that he has quit smoking. He has never used smokeless tobacco. He reports that he drinks alcohol. He reports that he does not use illicit drugs. family history includes Diabetes type II in his sister; Hypertension in his mother; and Prostate cancer in his father. Allergies  Allergen Reactions  . Ace Inhibitors       Review of Systems see hpi     Objective:   Physical Exam  Physical Exam  Nursing note and vitals reviewed. Constitutional: Appears well-developed and well-nourished. No distress.  HENT:  Head: Normocephalic and atraumatic.  Right Ear: External ear normal.  Left Ear: External ear normal.  Eyes: Conjunctivae are normal. No scleral icterus.  Neck: Neck supple. Carotid bruit is not present.  Cardiovascular: Normal rate, regular rhythm and normal heart sounds.  Exam reveals no gallop and no friction rub.   No murmur heard. Pulmonary/Chest: Effort normal and breath sounds normal. No respiratory distress. He has no wheezes. no rales.  Lymphadenopathy:    He has no cervical adenopathy.  Neurological:Alert.  Skin: Skin is warm and dry. Not diaphoretic.  Psychiatric: Has a normal mood and affect.  Diabetic foot exam: +2 DP pulses, no diabetic wounds, ulcerations or significant  callousing. Monofilament exam nl.  MSK: no midline ls tenderness or bony abn. Gait nl     Assessment & Plan:

## 2011-06-18 NOTE — Assessment & Plan Note (Signed)
Good control. Encourage diabetic diet, exercise and further wt loss. Schedule diabetic eye exam referral.

## 2011-06-18 NOTE — Patient Instructions (Signed)
Please schedule fasting labs prior to next visit Chem7, a1c-250.0 

## 2011-09-19 ENCOUNTER — Other Ambulatory Visit: Payer: Self-pay | Admitting: Internal Medicine

## 2011-09-20 NOTE — Telephone Encounter (Signed)
Denial sent to pharmacy to see refill from 06/18/11 #90 x 1 refill.

## 2011-10-08 LAB — BASIC METABOLIC PANEL
BUN: 25 mg/dL — ABNORMAL HIGH (ref 6–23)
CO2: 22 mEq/L (ref 19–32)
Chloride: 106 mEq/L (ref 96–112)
Creat: 0.57 mg/dL (ref 0.50–1.35)
Potassium: 4.1 mEq/L (ref 3.5–5.3)

## 2011-10-08 NOTE — Addendum Note (Signed)
Addended by: Mervin Kung A on: 10/08/2011 09:23 AM   Modules accepted: Orders

## 2011-10-08 NOTE — Telephone Encounter (Signed)
Pt presented to the lab, future orders released. 

## 2011-10-15 ENCOUNTER — Ambulatory Visit: Payer: Managed Care, Other (non HMO) | Admitting: Internal Medicine

## 2011-10-22 ENCOUNTER — Ambulatory Visit (INDEPENDENT_AMBULATORY_CARE_PROVIDER_SITE_OTHER): Payer: Managed Care, Other (non HMO) | Admitting: Internal Medicine

## 2011-10-22 ENCOUNTER — Encounter: Payer: Self-pay | Admitting: Internal Medicine

## 2011-10-22 VITALS — BP 128/78 | HR 86 | Temp 98.0°F | Resp 16 | Wt 230.5 lb

## 2011-10-22 DIAGNOSIS — E785 Hyperlipidemia, unspecified: Secondary | ICD-10-CM

## 2011-10-22 DIAGNOSIS — E119 Type 2 diabetes mellitus without complications: Secondary | ICD-10-CM

## 2011-10-22 DIAGNOSIS — Z79899 Other long term (current) drug therapy: Secondary | ICD-10-CM

## 2011-10-22 DIAGNOSIS — Z125 Encounter for screening for malignant neoplasm of prostate: Secondary | ICD-10-CM

## 2011-10-22 DIAGNOSIS — I10 Essential (primary) hypertension: Secondary | ICD-10-CM

## 2011-10-22 DIAGNOSIS — M549 Dorsalgia, unspecified: Secondary | ICD-10-CM

## 2011-10-22 MED ORDER — DICLOFENAC SODIUM 75 MG PO TBEC: DELAYED_RELEASE_TABLET | ORAL | Status: AC

## 2011-10-22 NOTE — Patient Instructions (Addendum)
Please schedule fasting labs prior to next visit Cbc, chem7, a1c, urine microalbumin-250.00, psa-prostate cancer screening and lipid/lft-272.4

## 2011-10-22 NOTE — Progress Notes (Signed)
  Subjective:    Patient ID: Drew Harris, male    DOB: August 21, 1955, 56 y.o.   MRN: [id number]  HPI Pt presents to clinic for followup of multiple medical problems. C/o left lateral lower trunk pain that intermittently radiates to back or groin. Previously helped by voltaren. Reviewed excellent A1c control. Diabetic eye exam utd.  Past Medical History  Diagnosis Date  . Allergic rhinitis   . Hyperlipidemia   . Borderline diabetes     Type 2  . Hypertension   . ED (erectile dysfunction)    Past Surgical History  Procedure Date  . Appendectomy     reports that he has quit smoking. He has never used smokeless tobacco. He reports that he drinks alcohol. He reports that he does not use illicit drugs. family history includes Diabetes type II in his sister; Hypertension in his mother; and Prostate cancer in his father. Allergies  Allergen Reactions  . Ace Inhibitors       Review of Systems see hpi     Objective:   Physical Exam  Physical Exam  Nursing note and vitals reviewed. Constitutional: Appears well-developed and well-nourished. No distress.  HENT:  Head: Normocephalic and atraumatic.  Right Ear: External ear normal.  Left Ear: External ear normal.  Eyes: Conjunctivae are normal. No scleral icterus.  Neck: Neck supple. Carotid bruit is not present.  Cardiovascular: Normal rate, regular rhythm and normal heart sounds.  Exam reveals no gallop and no friction rub.   No murmur heard. Pulmonary/Chest: Effort normal and breath sounds normal. No respiratory distress. He has no wheezes. no rales.  Lymphadenopathy:    He has no cervical adenopathy.  Neurological:Alert.  Skin: Skin is warm and dry. Not diaphoretic.  Psychiatric: Has a normal mood and affect.  MSK: gait nl. Lumbar back NT. Left lateral trunk without tenderness or mass      Assessment & Plan:

## 2011-10-24 NOTE — Assessment & Plan Note (Signed)
Good control

## 2011-10-24 NOTE — Assessment & Plan Note (Signed)
Normotensive and stable. Continue current regimen. Monitor bp as outpt and followup in clinic as scheduled.  

## 2011-10-24 NOTE — Assessment & Plan Note (Signed)
Declines PT. reattempt voltaren with food and no other nsaids. Followup if no improvement or worsening.

## 2011-11-27 ENCOUNTER — Other Ambulatory Visit: Payer: Self-pay | Admitting: Internal Medicine

## 2011-11-29 NOTE — Telephone Encounter (Signed)
Done/SLS 

## 2012-02-18 LAB — BASIC METABOLIC PANEL
BUN: 12 mg/dL (ref 6–23)
CO2: 28 mEq/L (ref 19–32)
Calcium: 9.3 mg/dL (ref 8.4–10.5)
Chloride: 105 mEq/L (ref 96–112)
Creat: 0.53 mg/dL (ref 0.50–1.35)
Glucose, Bld: 108 mg/dL — ABNORMAL HIGH (ref 70–99)

## 2012-02-18 LAB — CBC WITH DIFFERENTIAL/PLATELET
Eosinophils Absolute: 0.1 10*3/uL (ref 0.0–0.7)
Eosinophils Relative: 3 % (ref 0–5)
HCT: 37.1 % — ABNORMAL LOW (ref 39.0–52.0)
Lymphocytes Relative: 44 % (ref 12–46)
Lymphs Abs: 2 10*3/uL (ref 0.7–4.0)
MCH: 32.7 pg (ref 26.0–34.0)
MCV: 94.6 fL (ref 78.0–100.0)
Monocytes Absolute: 0.5 10*3/uL (ref 0.1–1.0)
Platelets: 240 10*3/uL (ref 150–400)
RBC: 3.92 MIL/uL — ABNORMAL LOW (ref 4.22–5.81)
WBC: 4.5 10*3/uL (ref 4.0–10.5)

## 2012-02-18 LAB — HEPATIC FUNCTION PANEL
ALT: 29 U/L (ref 0–53)
Indirect Bilirubin: 0.4 mg/dL (ref 0.0–0.9)
Total Protein: 6.9 g/dL (ref 6.0–8.3)

## 2012-02-18 LAB — LIPID PANEL
Cholesterol: 197 mg/dL (ref 0–200)
Triglycerides: 130 mg/dL (ref <150)
VLDL: 26 mg/dL (ref 0–40)

## 2012-02-18 LAB — PSA: PSA: 0.27 ng/mL (ref <=4.00)

## 2012-02-18 NOTE — Addendum Note (Signed)
Addended by: Regis Bill on: 02/18/2012 11:05 AM   Modules accepted: Orders

## 2012-02-19 ENCOUNTER — Other Ambulatory Visit: Payer: Self-pay | Admitting: Internal Medicine

## 2012-02-19 LAB — MICROALBUMIN / CREATININE URINE RATIO: Microalb, Ur: 4.17 mg/dL — ABNORMAL HIGH (ref 0.00–1.89)

## 2012-02-21 NOTE — Telephone Encounter (Signed)
Rx to pharmacy/SLS 

## 2012-02-25 ENCOUNTER — Encounter: Payer: Self-pay | Admitting: Internal Medicine

## 2012-02-25 ENCOUNTER — Ambulatory Visit (INDEPENDENT_AMBULATORY_CARE_PROVIDER_SITE_OTHER): Payer: Managed Care, Other (non HMO) | Admitting: Internal Medicine

## 2012-02-25 VITALS — BP 132/86 | HR 88 | Temp 98.2°F | Resp 16 | Wt 236.2 lb

## 2012-02-25 DIAGNOSIS — E785 Hyperlipidemia, unspecified: Secondary | ICD-10-CM

## 2012-02-25 DIAGNOSIS — I10 Essential (primary) hypertension: Secondary | ICD-10-CM

## 2012-02-25 DIAGNOSIS — E119 Type 2 diabetes mellitus without complications: Secondary | ICD-10-CM

## 2012-02-25 MED ORDER — SIMVASTATIN 20 MG PO TABS: 20.0000 mg | ORAL_TABLET | Freq: Every day | ORAL | Status: DC

## 2012-02-25 MED ORDER — OLMESARTAN MEDOXOMIL 40 MG PO TABS: 40.0000 mg | ORAL_TABLET | Freq: Every day | ORAL | Status: DC

## 2012-02-25 NOTE — Patient Instructions (Signed)
Please schedule fasting labs prior to next visit Cbc, chem7, a1c, urine microalbumin-250.00 and lipid-272.4

## 2012-02-25 NOTE — Assessment & Plan Note (Signed)
Low fat diet, exercise and weight loss.

## 2012-02-25 NOTE — Assessment & Plan Note (Signed)
Normotensive and stable. Continue current regimen. Monitor bp as outpt and followup in clinic as scheduled.  

## 2012-02-25 NOTE — Assessment & Plan Note (Signed)
Excellent control. Continue current regimen. Urine microalbumin mildly elevated but on max dose arb and cannot tolerate ace inhibitors.

## 2012-02-25 NOTE — Progress Notes (Signed)
  Subjective:    Patient ID: Drew Harris, male    DOB: 06-05-1955, 56 y.o.   MRN: [id number]  HPI Pt presents to clinic for followup of multiple medical problems. Reviewed labs with pt and copy provided. a1c remains under excellent control. LDL chol mildly above goal and pt relates to recent dietary indiscretion. Eye exam utd.  Past Medical History  Diagnosis Date  . Allergic rhinitis   . Hyperlipidemia   . Borderline diabetes     Type 2  . Hypertension   . ED (erectile dysfunction)    Past Surgical History  Procedure Date  . Appendectomy     reports that he has quit smoking. He has never used smokeless tobacco. He reports that he drinks alcohol. He reports that he does not use illicit drugs. family history includes Diabetes type II in his sister; Hypertension in his mother; and Prostate cancer in his father. Allergies  Allergen Reactions  . Ace Inhibitors      Review of Systems see hpi     Objective:   Physical Exam  Physical Exam  Nursing note and vitals reviewed. Constitutional: Appears well-developed and well-nourished. No distress.  HENT:  Head: Normocephalic and atraumatic.  Right Ear: External ear normal.  Left Ear: External ear normal.  Eyes: Conjunctivae are normal. No scleral icterus.  Neck: Neck supple. Carotid bruit is not present.  Cardiovascular: Normal rate, regular rhythm and normal heart sounds.  Exam reveals no gallop and no friction rub.   No murmur heard. Pulmonary/Chest: Effort normal and breath sounds normal. No respiratory distress. He has no wheezes. no rales.  Lymphadenopathy:    He has no cervical adenopathy.  Neurological:Alert.  Skin: Skin is warm and dry. Not diaphoretic.  Psychiatric: Has a normal mood and affect.        Assessment & Plan:

## 2012-04-08 ENCOUNTER — Other Ambulatory Visit: Payer: Self-pay | Admitting: Internal Medicine

## 2012-06-30 ENCOUNTER — Ambulatory Visit: Payer: Managed Care, Other (non HMO) | Admitting: Family

## 2012-06-30 ENCOUNTER — Ambulatory Visit: Payer: Managed Care, Other (non HMO) | Admitting: Internal Medicine

## 2012-07-04 ENCOUNTER — Telehealth: Payer: Self-pay

## 2012-07-04 DIAGNOSIS — E119 Type 2 diabetes mellitus without complications: Secondary | ICD-10-CM

## 2012-07-04 DIAGNOSIS — E785 Hyperlipidemia, unspecified: Secondary | ICD-10-CM

## 2012-07-04 LAB — LIPID PANEL: Cholesterol: 167 mg/dL (ref 0–200)

## 2012-07-04 LAB — HEMOGLOBIN A1C
Hgb A1c MFr Bld: 5.4 % (ref <5.7)
Mean Plasma Glucose: 108 mg/dL (ref <117)

## 2012-07-04 LAB — CBC
MCH: 32.5 pg (ref 26.0–34.0)
MCHC: 33.9 g/dL (ref 30.0–36.0)
Platelets: 223 10*3/uL (ref 150–400)
RBC: 3.85 MIL/uL — ABNORMAL LOW (ref 4.22–5.81)
RDW: 14.9 % (ref 11.5–15.5)

## 2012-07-04 LAB — BASIC METABOLIC PANEL
CO2: 25 mEq/L (ref 19–32)
Calcium: 9.2 mg/dL (ref 8.4–10.5)
Sodium: 138 mEq/L (ref 135–145)

## 2012-07-04 NOTE — Telephone Encounter (Signed)
Labs ordered.

## 2012-07-05 LAB — MICROALBUMIN, URINE: Microalb, Ur: 3.37 mg/dL — ABNORMAL HIGH (ref 0.00–1.89)

## 2012-07-17 ENCOUNTER — Ambulatory Visit: Payer: Managed Care, Other (non HMO) | Admitting: Family

## 2012-07-17 ENCOUNTER — Encounter: Payer: Self-pay | Admitting: Family

## 2012-07-17 ENCOUNTER — Ambulatory Visit (INDEPENDENT_AMBULATORY_CARE_PROVIDER_SITE_OTHER): Payer: Managed Care, Other (non HMO) | Admitting: Family

## 2012-07-17 VITALS — BP 122/80 | HR 90 | Temp 97.9°F | Resp 16 | Ht 69.0 in | Wt 229.0 lb

## 2012-07-17 DIAGNOSIS — M545 Low back pain: Secondary | ICD-10-CM

## 2012-07-17 DIAGNOSIS — Z23 Encounter for immunization: Secondary | ICD-10-CM

## 2012-07-17 DIAGNOSIS — D649 Anemia, unspecified: Secondary | ICD-10-CM

## 2012-07-17 DIAGNOSIS — R6882 Decreased libido: Secondary | ICD-10-CM

## 2012-07-17 DIAGNOSIS — F528 Other sexual dysfunction not due to a substance or known physiological condition: Secondary | ICD-10-CM

## 2012-07-17 LAB — IRON: Iron: 94 ug/dL (ref 42–165)

## 2012-07-17 LAB — VITAMIN B12: Vitamin B-12: 313 pg/mL (ref 211–911)

## 2012-07-17 LAB — FOLATE: Folate: 10.1 ng/mL

## 2012-07-17 MED ORDER — TADALAFIL 5 MG PO TABS: 5.0000 mg | ORAL_TABLET | Freq: Every day | ORAL | Status: DC | PRN

## 2012-07-17 NOTE — Assessment & Plan Note (Signed)
Tolerating statin, LDL at goal. Continue same.  

## 2012-07-17 NOTE — Assessment & Plan Note (Signed)
Requests refill on cialis. Reports low libido. Will send testosterone level.

## 2012-07-17 NOTE — Progress Notes (Signed)
Subjective:    Patient ID: Drew Harris, male    DOB: 1955-10-05, 57 y.o.   MRN: [id number]  HPI  [name] is a 57 yr old male who presents today for follow up of multiple medical problems.  1) DM2-  Currently maintained on metforming, ACE.  A1C 5.4.  2) Low back pain-  Reports left low back pain.  Reports that this tends to happen if he sleeps on his sofa.    3) Hyperlipidemia- on simvastatin. Denies myagia.    4) HTN- Currently on amlodipine.  He denies CP/SOB swelling  5) ED- currently maintained on cialis. Notes poor sex drive recently.    6) Anemia- noted on recent cbc to have a mild normocytic anemia- denies black or bloody stool.     Review of Systems See HPI  Past Medical History  Diagnosis Date  . Allergic rhinitis   . Hyperlipidemia   . Borderline diabetes     Type 2  . Hypertension   . ED (erectile dysfunction)     History   Social History  . Marital Status: Single    Spouse Name: N/A    Number of Children: N/A  . Years of Education: N/A   Occupational History  . Not on file.   Social History Main Topics  . Smoking status: Former Games developer  . Smokeless tobacco: Never Used  . Alcohol Use: Yes  . Drug Use: No  . Sexually Active: Not on file   Other Topics Concern  . Not on file   Social History Narrative  . No narrative on file    Past Surgical History  Procedure Laterality Date  . Appendectomy      Family History  Problem Relation Age of Onset  . Hypertension Mother   . Prostate cancer Father     deceased age 64 secondary to prostate cancer  . Diabetes type II Sister     Allergies  Allergen Reactions  . Ace Inhibitors     Current Outpatient Prescriptions on File Prior to Visit  Medication Sig Dispense Refill  . amLODipine (NORVASC) 10 MG tablet TAKE 1 TABLET BY MOUTH EVERY DAY  90 tablet  1  . metFORMIN (GLUCOPHAGE-XR) 500 MG 24 hr tablet Take 2 tablets (1,000 mg total) by mouth 2 (two) times daily.  120 tablet  2  .  olmesartan (BENICAR) 40 MG tablet Take 1 tablet (40 mg total) by mouth daily.  90 tablet  1  . simvastatin (ZOCOR) 20 MG tablet Take 1 tablet (20 mg total) by mouth at bedtime.  90 tablet  1  . tadalafil (CIALIS) 5 MG tablet Take 1 tablet (5 mg total) by mouth daily as needed.  10 tablet  3   No current facility-administered medications on file prior to visit.    BP 122/80  Pulse 90  Temp(Src) 97.9 F (36.6 C) (Oral)  Resp 16  Ht 5\' 9"  (1.753 m)  Wt 229 lb 0.6 oz (103.892 kg)  BMI 33.81 kg/m2  SpO2 99%       Objective:   Physical Exam  Constitutional: He is oriented to person, place, and time. He appears well-developed and well-nourished. No distress.  HENT:  Head: Normocephalic and atraumatic.  Cardiovascular: Normal rate and regular rhythm.   No murmur heard. Pulmonary/Chest: Effort normal and breath sounds normal. No respiratory distress. He has no wheezes. He has no rales. He exhibits no tenderness.  Musculoskeletal: He exhibits no edema.  Neurological: He is alert and oriented  to person, place, and time.  Psychiatric: He has a normal mood and affect. His behavior is normal. Judgment and thought content normal.          Assessment & Plan:

## 2012-07-17 NOTE — Patient Instructions (Addendum)
Please complete your lab work prior to leaving. Complete stool kit and return at your earliest. Follow up in 3 months.

## 2012-07-17 NOTE — Assessment & Plan Note (Signed)
BP Readings from Last 3 Encounters:  07/17/12 122/80  02/25/12 132/86  10/22/11 128/78    BP stable on current meds.  Continue same.

## 2012-07-17 NOTE — Assessment & Plan Note (Signed)
New.  Check- Iron, folate, b12, ifob.

## 2012-07-17 NOTE — Assessment & Plan Note (Signed)
Mild.  Likley musculoskeletal. Will obtain UA to rule out hematuria.  If + consider CT abdomen/pelvis.

## 2012-07-17 NOTE — Assessment & Plan Note (Addendum)
A1C 5.4!  Will cut back metformin from 1000mg  to 500mg  daily. Add baby aspirin for cardiac protection.  Pneumovax today.

## 2012-07-18 LAB — URINALYSIS, ROUTINE W REFLEX MICROSCOPIC
Nitrite: NEGATIVE
Specific Gravity, Urine: 1.024 (ref 1.005–1.030)
Urobilinogen, UA: 0.2 mg/dL (ref 0.0–1.0)
pH: 5 (ref 5.0–8.0)

## 2012-07-25 ENCOUNTER — Telehealth: Payer: Self-pay | Admitting: Family

## 2012-07-25 DIAGNOSIS — R7989 Other specified abnormal findings of blood chemistry: Secondary | ICD-10-CM

## 2012-07-26 ENCOUNTER — Telehealth: Payer: Self-pay | Admitting: Family

## 2012-07-26 NOTE — Telephone Encounter (Signed)
Message copied by Sandford Craze on Wed Jul 26, 2012 12:26 PM ------      Message from: Carlus Pavlov      Created: Wed Jul 26, 2012  8:54 AM       Homer Miller,      I would only treat if free testosterone drawn fasting, at 8 am, is clearly low x2. He is not far from the [facility] for the free testosterone, so I would repeat his labs in ~3 months - fasting, at 8 am. For the ED, I would use PDE5 inhibitors if there are no contraindications (I see he is on Cialis). Losing weight, improving his diet, and making sure he sleeps well at night - no OSA, etc., will improve the testosterone levels.      If he has low T at next check (as above), I would be happy to see him.      Hope this helps,      [place]            ----- Message -----         From: Sandford Craze, NP         Sent: 07/25/2012  10:15 PM           To: Carlus Pavlov, MD            F[place]on Reek ? Please.  His testosterone levels are low normal.  + ED, low libido.  Would you consider treating?  If so, let me know I will arrange consult with you.             Thanks!            Vicki Chaffin       ------

## 2012-08-01 NOTE — Telephone Encounter (Signed)
Opened in error

## 2012-08-03 ENCOUNTER — Telehealth: Payer: Self-pay | Admitting: *Deleted

## 2012-08-03 DIAGNOSIS — F528 Other sexual dysfunction not due to a substance or known physiological condition: Secondary | ICD-10-CM

## 2012-08-03 DIAGNOSIS — R7989 Other specified abnormal findings of blood chemistry: Secondary | ICD-10-CM

## 2012-08-03 NOTE — Telephone Encounter (Signed)
Notified pt and he voices understanding. Future lab order entered. 

## 2012-08-03 NOTE — Telephone Encounter (Signed)
Message copied by Kathi Simpers on Thu Aug 03, 2012  2:57 PM ------      Message from: O'SULLIVAN, MELISSA      Created: Wed Jul 26, 2012 10:09 AM       Please call pt and let him know that testosterone low limit normal.  Would not start testosterone at this point, but would recommend that he repeat Testosterone (free/total) at 8 AM in the morning in 3 months.  In the meantime,  Losing weight, improving his diet, and making sure he sleeps well at night should all help his testosterone level. Urine and B12 are normal. ------

## 2012-09-14 ENCOUNTER — Other Ambulatory Visit: Payer: Self-pay

## 2012-10-03 ENCOUNTER — Other Ambulatory Visit: Payer: Self-pay | Admitting: Internal Medicine

## 2012-10-16 ENCOUNTER — Ambulatory Visit: Payer: Managed Care, Other (non HMO) | Admitting: Family

## 2012-10-18 ENCOUNTER — Encounter: Payer: Self-pay | Admitting: Family

## 2012-10-18 ENCOUNTER — Ambulatory Visit (INDEPENDENT_AMBULATORY_CARE_PROVIDER_SITE_OTHER): Payer: Managed Care, Other (non HMO) | Admitting: Family

## 2012-10-18 VITALS — BP 130/86 | HR 92 | Temp 98.2°F | Resp 18 | Ht 69.0 in | Wt 227.0 lb

## 2012-10-18 DIAGNOSIS — F17200 Nicotine dependence, unspecified, uncomplicated: Secondary | ICD-10-CM

## 2012-10-18 DIAGNOSIS — I10 Essential (primary) hypertension: Secondary | ICD-10-CM

## 2012-10-18 DIAGNOSIS — F528 Other sexual dysfunction not due to a substance or known physiological condition: Secondary | ICD-10-CM

## 2012-10-18 DIAGNOSIS — E119 Type 2 diabetes mellitus without complications: Secondary | ICD-10-CM

## 2012-10-18 DIAGNOSIS — B353 Tinea pedis: Secondary | ICD-10-CM | POA: Insufficient documentation

## 2012-10-18 DIAGNOSIS — E785 Hyperlipidemia, unspecified: Secondary | ICD-10-CM

## 2012-10-18 LAB — HEPATIC FUNCTION PANEL
ALT: 31 U/L (ref 0–53)
Indirect Bilirubin: 0.6 mg/dL (ref 0.0–0.9)
Total Protein: 7.3 g/dL (ref 6.0–8.3)

## 2012-10-18 LAB — BASIC METABOLIC PANEL
BUN: 20 mg/dL (ref 6–23)
CO2: 27 mEq/L (ref 19–32)
Calcium: 9.6 mg/dL (ref 8.4–10.5)
Chloride: 101 mEq/L (ref 96–112)
Creat: 0.94 mg/dL (ref 0.50–1.35)

## 2012-10-18 NOTE — Assessment & Plan Note (Signed)
BP Readings from Last 3 Encounters:  10/18/12 130/86  07/17/12 122/80  02/25/12 132/86   BP stable on current meds.  Continue same.

## 2012-10-18 NOTE — Assessment & Plan Note (Signed)
Reports cialis is effective.  Continue same.

## 2012-10-18 NOTE — Assessment & Plan Note (Signed)
Stable on statin. Continue same.  

## 2012-10-18 NOTE — Assessment & Plan Note (Signed)
Counseled pt on smoking cessation.

## 2012-10-18 NOTE — Assessment & Plan Note (Signed)
Recommended trial of otc lamisil.

## 2012-10-18 NOTE — Assessment & Plan Note (Signed)
Clinically stable, obtain A1C, continue metformin. Pt will schedule eye exam.

## 2012-10-18 NOTE — Patient Instructions (Addendum)
Please complete lab work prior to leaving. Follow up in 3 months- come fasting in the AM to this appointment.

## 2012-10-18 NOTE — Progress Notes (Signed)
  Subjective:    Patient ID: Drew Harris, male    DOB: 1955-09-15, 57 y.o.   MRN: [id number]  HPI  DM2- reports that he is feeling well on decreased dose of metformin. Due for eye exam.  Pt will schedule.    HTN- BP looks good. Pt is maintained on amlodipine and benicar. He denies CP/SOB or swelling.  ED- reports good response with cialis.    Tobacco-  Down to 2-3 cigarettes a day.    Hyperlipidemia- he continues simvastatin. LDL at goal last April.   Review of Systems See HPI  Past Medical History  Diagnosis Date  . Allergic rhinitis   . Hyperlipidemia   . Borderline diabetes     Type 2  . Hypertension   . ED (erectile dysfunction)     History   Social History  . Marital Status: Single    Spouse Name: N/A    Number of Children: N/A  . Years of Education: N/A   Occupational History  . Not on file.   Social History Main Topics  . Smoking status: Former Games developer  . Smokeless tobacco: Never Used  . Alcohol Use: Yes  . Drug Use: No  . Sexual Activity: Not on file   Other Topics Concern  . Not on file   Social History Narrative  . No narrative on file    Past Surgical History  Procedure Laterality Date  . Appendectomy      Family History  Problem Relation Age of Onset  . Hypertension Mother   . Prostate cancer Father     deceased age 37 secondary to prostate cancer  . Diabetes type II Sister     Allergies  Allergen Reactions  . Ace Inhibitors     Current Outpatient Prescriptions on File Prior to Visit  Medication Sig Dispense Refill  . amLODipine (NORVASC) 10 MG tablet TAKE 1 TABLET BY MOUTH EVERY DAY  90 tablet  1  . aspirin EC 81 MG tablet Take 81 mg by mouth daily.      Marland Kitchen BENICAR 40 MG tablet TAKE 1 TABLET BY MOUTH EVERY DAY  90 tablet  1  . metFORMIN (GLUCOPHAGE-XR) 500 MG 24 hr tablet Take 500 mg by mouth daily.      . simvastatin (ZOCOR) 20 MG tablet Take 1 tablet (20 mg total) by mouth at bedtime.  90 tablet  1  . tadalafil (CIALIS) 5  MG tablet Take 1 tablet (5 mg total) by mouth daily as needed.  10 tablet  3   No current facility-administered medications on file prior to visit.    BP 130/86  Pulse 92  Temp(Src) 98.2 F (36.8 C) (Oral)  Resp 18  Ht 5\' 9"  (1.753 m)  Wt 227 lb (102.967 kg)  BMI 33.51 kg/m2  SpO2 98%       Objective:   Physical Exam  Constitutional: He is oriented to person, place, and time. He appears well-developed and well-nourished. No distress.  Cardiovascular: Normal rate and regular rhythm.   No murmur heard. Pulmonary/Chest: Effort normal and breath sounds normal. No respiratory distress. He has no wheezes. He has no rales. He exhibits no tenderness.  Musculoskeletal: He exhibits no edema.  Neurological: He is alert and oriented to person, place, and time.  Psychiatric: He has a normal mood and affect. His behavior is normal. Judgment and thought content normal.          Assessment & Plan:

## 2012-10-19 LAB — HEMOGLOBIN A1C
Hgb A1c MFr Bld: 5.5 % (ref <5.7)
Mean Plasma Glucose: 111 mg/dL (ref <117)

## 2012-10-24 ENCOUNTER — Encounter: Payer: Self-pay | Admitting: Family

## 2013-01-30 ENCOUNTER — Ambulatory Visit (INDEPENDENT_AMBULATORY_CARE_PROVIDER_SITE_OTHER): Payer: Managed Care, Other (non HMO) | Admitting: Family

## 2013-01-30 ENCOUNTER — Encounter: Payer: Self-pay | Admitting: Family

## 2013-01-30 VITALS — BP 154/92 | HR 81 | Temp 98.2°F | Resp 16 | Ht 69.0 in | Wt 230.0 lb

## 2013-01-30 DIAGNOSIS — E119 Type 2 diabetes mellitus without complications: Secondary | ICD-10-CM

## 2013-01-30 DIAGNOSIS — I10 Essential (primary) hypertension: Secondary | ICD-10-CM

## 2013-01-30 DIAGNOSIS — Z23 Encounter for immunization: Secondary | ICD-10-CM

## 2013-01-30 DIAGNOSIS — E785 Hyperlipidemia, unspecified: Secondary | ICD-10-CM

## 2013-01-30 MED ORDER — SIMVASTATIN 20 MG PO TABS: 20.0000 mg | ORAL_TABLET | Freq: Every day | ORAL | Status: DC

## 2013-01-30 MED ORDER — METFORMIN HCL ER 500 MG PO TB24: 500.0000 mg | ORAL_TABLET | Freq: Every day | ORAL | Status: DC

## 2013-01-30 MED ORDER — AMLODIPINE BESYLATE 10 MG PO TABS: ORAL_TABLET | ORAL | Status: DC

## 2013-01-30 MED ORDER — OLMESARTAN MEDOXOMIL 40 MG PO TABS: ORAL_TABLET | ORAL | Status: DC

## 2013-01-30 NOTE — Progress Notes (Signed)
Subjective:    Patient ID: Drew Harris, male    DOB: 1955-04-25, 57 y.o.   MRN: [id number]  HPI  Drew Harris is a 57 yr old male who presents today for follow up of multiple medical problems:  1) HTN- maintained on benicar. Reports dietary indescresion.  Did not take his BP meds this morning.   BP Readings from Last 3 Encounters:  01/30/13 154/92  10/18/12 130/86  07/17/12 122/80    2) DM2- maintained on metformin. Last A1C was excellent.   Lab Results  Component Value Date   HGBA1C 5.5 10/18/2012  He has not been able to get in yet with eye doctor and plans to do after the first of the year.    3) Hyperlipidemia-Maintained on simvastatin. Last LDL at goal.  Denies associated myalgias.   Lab Results  Component Value Date   CHOL 167 07/04/2012   HDL 69 07/04/2012   LDLCALC 79 07/04/2012   LDLDIRECT 120.8 01/05/2008   TRIG 96 07/04/2012   CHOLHDL 2.4 07/04/2012     Review of Systems See HPI  Past Medical History  Diagnosis Date  . Allergic rhinitis   . Hyperlipidemia   . Borderline diabetes     Type 2  . Hypertension   . ED (erectile dysfunction)     History   Social History  . Marital Status: Single    Spouse Name: N/A    Number of Children: N/A  . Years of Education: N/A   Occupational History  . Not on file.   Social History Main Topics  . Smoking status: Former Games developer  . Smokeless tobacco: Never Used  . Alcohol Use: Yes  . Drug Use: No  . Sexual Activity: Not on file   Other Topics Concern  . Not on file   Social History Narrative  . No narrative on file    Past Surgical History  Procedure Laterality Date  . Appendectomy      Family History  Problem Relation Age of Onset  . Hypertension Mother   . Prostate cancer Father     deceased age 20 secondary to prostate cancer  . Diabetes type II Sister     Allergies  Allergen Reactions  . Ace Inhibitors     Current Outpatient Prescriptions on File Prior to Visit  Medication Sig  Dispense Refill  . amLODipine (NORVASC) 10 MG tablet TAKE 1 TABLET BY MOUTH EVERY DAY  90 tablet  1  . aspirin EC 81 MG tablet Take 81 mg by mouth daily.      Marland Kitchen BENICAR 40 MG tablet TAKE 1 TABLET BY MOUTH EVERY DAY  90 tablet  1  . metFORMIN (GLUCOPHAGE-XR) 500 MG 24 hr tablet Take 500 mg by mouth daily.      . simvastatin (ZOCOR) 20 MG tablet Take 1 tablet (20 mg total) by mouth at bedtime.  90 tablet  1  . tadalafil (CIALIS) 5 MG tablet Take 1 tablet (5 mg total) by mouth daily as needed.  10 tablet  3   No current facility-administered medications on file prior to visit.    BP 154/92  Pulse 81  Temp(Src) 98.2 F (36.8 C) (Oral)  Resp 16  Ht 5\' 9"  (1.753 m)  Wt 230 lb (104.327 kg)  BMI 33.95 kg/m2  SpO2 99%       Objective:   Physical Exam  Constitutional: He is oriented to person, place, and time. He appears well-developed and well-nourished. No distress.  Cardiovascular: Normal  rate and regular rhythm.   No murmur heard. Pulmonary/Chest: Effort normal and breath sounds normal. No respiratory distress. He has no wheezes. He has no rales. He exhibits no tenderness.  Musculoskeletal: He exhibits no edema.  Neurological: He is alert and oriented to person, place, and time.  Psychiatric: He has a normal mood and affect. His behavior is normal. Judgment and thought content normal.          Assessment & Plan:

## 2013-01-30 NOTE — Assessment & Plan Note (Signed)
BP up today, but he did not take AM meds.  Plan bp recheck in 1 month at nurse visit.

## 2013-01-30 NOTE — Assessment & Plan Note (Signed)
Tolerating statin. Lipids at goal.  

## 2013-01-30 NOTE — Patient Instructions (Signed)
Please complete your lab work prior to leaving. Follow up in 1 month for blood pressure check. Follow up in 4 months for routine follow up visit.

## 2013-01-30 NOTE — Assessment & Plan Note (Signed)
Continue metformin, check A1C. Pt to schedule eye exam.

## 2013-01-31 LAB — MICROALBUMIN / CREATININE URINE RATIO
Creatinine, Urine: 105.9 mg/dL
Microalb Creat Ratio: 106.3 mg/g — ABNORMAL HIGH (ref 0.0–30.0)
Microalb, Ur: 11.26 mg/dL — ABNORMAL HIGH (ref 0.00–1.89)

## 2013-02-04 ENCOUNTER — Encounter: Payer: Self-pay | Admitting: Family

## 2013-02-26 ENCOUNTER — Ambulatory Visit: Payer: Managed Care, Other (non HMO) | Admitting: Family

## 2013-02-26 ENCOUNTER — Encounter: Payer: Self-pay | Admitting: Family

## 2013-02-26 ENCOUNTER — Ambulatory Visit (INDEPENDENT_AMBULATORY_CARE_PROVIDER_SITE_OTHER): Payer: Managed Care, Other (non HMO) | Admitting: Family

## 2013-02-26 VITALS — BP 138/84 | HR 93 | Temp 97.8°F | Resp 16 | Ht 69.0 in | Wt 231.1 lb

## 2013-02-26 DIAGNOSIS — I10 Essential (primary) hypertension: Secondary | ICD-10-CM

## 2013-02-26 NOTE — Patient Instructions (Signed)
Please follow up in 3 months.  

## 2013-02-26 NOTE — Assessment & Plan Note (Signed)
Improved,  Continue current meds.

## 2013-02-26 NOTE — Progress Notes (Signed)
   Subjective:    Patient ID: Drew Harris, male    DOB: [birth year], 57 y.o.   MRN: [id number]  HPI  Drew Harris is a 57 yr old male who presents today for follow up of HTN.  Last visit his bp was elevated but he admitted to not taking his BP med that AM.    BP Readings from Last 3 Encounters:  02/26/13 138/84  01/30/13 154/92  10/18/12 130/86   Reports that he continues amlodipine and benicar.    Review of Systems See HPI  Past Medical History  Diagnosis Date  . Allergic rhinitis   . Hyperlipidemia   . Borderline diabetes     Type 2  . Hypertension   . ED (erectile dysfunction)     History   Social History  . Marital Status: Single    Spouse Name: N/A    Number of Children: N/A  . Years of Education: N/A   Occupational History  . Not on file.   Social History Main Topics  . Smoking status: Former Games developer  . Smokeless tobacco: Never Used  . Alcohol Use: Yes  . Drug Use: No  . Sexual Activity: Not on file   Other Topics Concern  . Not on file   Social History Narrative  . No narrative on file    Past Surgical History  Procedure Laterality Date  . Appendectomy      Family History  Problem Relation Age of Onset  . Hypertension Mother   . Prostate cancer Father     deceased age 14 secondary to prostate cancer  . Diabetes type II Sister     Allergies  Allergen Reactions  . Ace Inhibitors     Current Outpatient Prescriptions on File Prior to Visit  Medication Sig Dispense Refill  . amLODipine (NORVASC) 10 MG tablet TAKE 1 TABLET BY MOUTH EVERY DAY  90 tablet  0  . aspirin EC 81 MG tablet Take 81 mg by mouth daily.      . metFORMIN (GLUCOPHAGE-XR) 500 MG 24 hr tablet Take 1 tablet (500 mg total) by mouth daily.  90 tablet  0  . olmesartan (BENICAR) 40 MG tablet TAKE 1 TABLET BY MOUTH EVERY DAY  90 tablet  0  . simvastatin (ZOCOR) 20 MG tablet Take 1 tablet (20 mg total) by mouth at bedtime.  90 tablet  0  . tadalafil (CIALIS) 5 MG tablet Take 1  tablet (5 mg total) by mouth daily as needed.  10 tablet  3   No current facility-administered medications on file prior to visit.    BP 138/84  Pulse 93  Temp(Src) 97.8 F (36.6 C) (Oral)  Resp 16  Ht 5\' 9"  (1.753 m)  Wt 231 lb 1.9 oz (104.835 kg)  BMI 34.11 kg/m2  SpO2 99%       Objective:   Physical Exam  Constitutional: He is oriented to person, place, and time. He appears well-developed and well-nourished. No distress.  HENT:  Head: Normocephalic and atraumatic.  Cardiovascular: Normal rate and regular rhythm.   No murmur heard. Pulmonary/Chest: Effort normal and breath sounds normal. No respiratory distress. He has no wheezes. He has no rales. He exhibits no tenderness.  Neurological: He is alert and oriented to person, place, and time.  Psychiatric: He has a normal mood and affect. His behavior is normal. Judgment and thought content normal.          Assessment & Plan:

## 2013-02-26 NOTE — Progress Notes (Signed)
Pre visit review using our clinic review tool, if applicable. No additional management support is needed unless otherwise documented below in the visit note. 

## 2013-05-30 ENCOUNTER — Ambulatory Visit: Payer: Managed Care, Other (non HMO) | Admitting: Family

## 2013-06-11 ENCOUNTER — Ambulatory Visit (INDEPENDENT_AMBULATORY_CARE_PROVIDER_SITE_OTHER): Payer: Managed Care, Other (non HMO) | Admitting: Family

## 2013-06-11 ENCOUNTER — Encounter: Payer: Self-pay | Admitting: Family

## 2013-06-11 VITALS — BP 130/82 | HR 92 | Temp 98.4°F | Resp 16 | Ht 69.0 in | Wt 223.0 lb

## 2013-06-11 DIAGNOSIS — E785 Hyperlipidemia, unspecified: Secondary | ICD-10-CM

## 2013-06-11 DIAGNOSIS — I10 Essential (primary) hypertension: Secondary | ICD-10-CM

## 2013-06-11 DIAGNOSIS — E119 Type 2 diabetes mellitus without complications: Secondary | ICD-10-CM

## 2013-06-11 MED ORDER — METFORMIN HCL ER 500 MG PO TB24: 500.0000 mg | ORAL_TABLET | Freq: Every day | ORAL | Status: DC

## 2013-06-11 MED ORDER — AMLODIPINE BESYLATE 10 MG PO TABS: ORAL_TABLET | ORAL | Status: DC

## 2013-06-11 MED ORDER — OLMESARTAN MEDOXOMIL 40 MG PO TABS: ORAL_TABLET | ORAL | Status: DC

## 2013-06-11 MED ORDER — TADALAFIL 5 MG PO TABS: 5.0000 mg | ORAL_TABLET | Freq: Every day | ORAL | Status: DC | PRN

## 2013-06-11 MED ORDER — SIMVASTATIN 20 MG PO TABS: 20.0000 mg | ORAL_TABLET | Freq: Every day | ORAL | Status: DC

## 2013-06-11 NOTE — Patient Instructions (Signed)
Please return fasting for lab work at your earliest convenience.  Follow up in 3 months.

## 2013-06-11 NOTE — Progress Notes (Signed)
Subjective:    Patient ID: Drew Harris, male    DOB: 01/10/1956, 58 y.o.   MRN: [id number]  HPI  Drew Harris is a 58 yr old male who presents today for follow up.  1) DM2- last A1C was drawn 01/30/13 and was controlled at 5.8.  He is maintained on metformin. Had eye exam 1 week ago and was reportedly negative for retinopathy.   He does not check sugars at home. Denies symptoms of hypoglycemia.   2) Hyperlipidemia- last LDL was 1 year ago and was at goal.  LDL was 79.  He is maintained on simvastatin 20.    3) HTN-Current BP meds include benicar and amlodipine. Denies CP/SOB or swelling.    BP Readings from Last 3 Encounters:  06/11/13 130/82  02/26/13 138/84  01/30/13 154/92      Review of Systems See HPI  Past Medical History  Diagnosis Date  . Allergic rhinitis   . Hyperlipidemia   . Borderline diabetes     Type 2  . Hypertension   . ED (erectile dysfunction)     History   Social History  . Marital Status: Single    Spouse Name: N/A    Number of Children: N/A  . Years of Education: N/A   Occupational History  . Not on file.   Social History Main Topics  . Smoking status: Former Research scientist (life sciences)  . Smokeless tobacco: Never Used  . Alcohol Use: Yes  . Drug Use: No  . Sexual Activity: Not on file   Other Topics Concern  . Not on file   Social History Narrative  . No narrative on file    Past Surgical History  Procedure Laterality Date  . Appendectomy      Family History  Problem Relation Age of Onset  . Hypertension Mother   . Prostate cancer Father     deceased age 52 secondary to prostate cancer  . Diabetes type II Sister     Allergies  Allergen Reactions  . Ace Inhibitors     Current Outpatient Prescriptions on File Prior to Visit  Medication Sig Dispense Refill  . amLODipine (NORVASC) 10 MG tablet TAKE 1 TABLET BY MOUTH EVERY DAY  90 tablet  0  . aspirin EC 81 MG tablet Take 81 mg by mouth daily.      . metFORMIN (GLUCOPHAGE-XR) 500 MG  24 hr tablet Take 1 tablet (500 mg total) by mouth daily.  90 tablet  0  . olmesartan (BENICAR) 40 MG tablet TAKE 1 TABLET BY MOUTH EVERY DAY  90 tablet  0  . simvastatin (ZOCOR) 20 MG tablet Take 1 tablet (20 mg total) by mouth at bedtime.  90 tablet  0  . tadalafil (CIALIS) 5 MG tablet Take 1 tablet (5 mg total) by mouth daily as needed.  10 tablet  3   No current facility-administered medications on file prior to visit.    BP 130/82  Pulse 92  Temp(Src) 98.4 F (36.9 C) (Oral)  Resp 16  Ht 5\' 9"  (1.753 m)  Wt 223 lb (101.152 kg)  BMI 32.92 kg/m2  SpO2 99%       Objective:   Physical Exam  Constitutional: He is oriented to person, place, and time. He appears well-developed and well-nourished. No distress.  HENT:  Head: Normocephalic and atraumatic.  Cardiovascular: Normal rate and regular rhythm.   No murmur heard. Pulmonary/Chest: Effort normal and breath sounds normal. No respiratory distress. He has no wheezes. He  has no rales. He exhibits no tenderness.  Neurological: He is alert and oriented to person, place, and time.  Psychiatric: He has a normal mood and affect. His behavior is normal. Judgment and thought content normal.          Assessment & Plan:

## 2013-06-11 NOTE — Progress Notes (Signed)
Pre visit review using our clinic review tool, if applicable. No additional management support is needed unless otherwise documented below in the visit note. 

## 2013-06-17 NOTE — Assessment & Plan Note (Signed)
Obtain lipid panel, lft.  Continue statin.

## 2013-06-17 NOTE — Assessment & Plan Note (Signed)
BP stable on current meds. Continue same.  

## 2013-06-17 NOTE — Assessment & Plan Note (Signed)
Clinically stable. Obtain A1C, bmet.

## 2013-06-20 NOTE — Telephone Encounter (Signed)
Lab order

## 2013-06-29 ENCOUNTER — Telehealth: Payer: Self-pay

## 2013-06-29 ENCOUNTER — Telehealth: Payer: Self-pay | Admitting: *Deleted

## 2013-06-29 NOTE — Telephone Encounter (Signed)
Relevant patient education mailed to patient.  

## 2013-06-29 NOTE — Telephone Encounter (Signed)
Message copied by Ronny Flurry on Fri Jun 29, 2013  3:03 PM ------      Message from: O'SULLIVAN, MELISSA      Created: Mon Jun 11, 2013  6:09 PM       Pt reports normal eye exam 1 week ago ------

## 2013-07-13 ENCOUNTER — Ambulatory Visit: Payer: Managed Care, Other (non HMO) | Admitting: Family

## 2013-07-16 ENCOUNTER — Encounter: Payer: Self-pay | Admitting: Family

## 2013-07-16 ENCOUNTER — Ambulatory Visit (INDEPENDENT_AMBULATORY_CARE_PROVIDER_SITE_OTHER): Payer: Managed Care, Other (non HMO) | Admitting: Family

## 2013-07-16 VITALS — BP 126/80 | HR 89 | Temp 98.7°F | Resp 16 | Ht 69.0 in | Wt 224.1 lb

## 2013-07-16 DIAGNOSIS — F411 Generalized anxiety disorder: Secondary | ICD-10-CM | POA: Insufficient documentation

## 2013-07-16 MED ORDER — ESCITALOPRAM OXALATE 10 MG PO TABS: ORAL_TABLET | ORAL | Status: DC
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
## 2013-07-16 NOTE — Assessment & Plan Note (Signed)
We discussed referral to therapist. He wishes to hold off on this for now.  He is agreeable to start SSRI.  I instructed pt to start lexapro 10mg , 1/2 tablet once daily for 1 week and then increase to a full tablet once daily on week two as tolerated.  We discussed common side effects such as nausea, drowsiness and weight gain.  Also discussed rare but serious side effect of suicide ideation.  She is instructed to discontinue medication go directly to ED if this occurs.  Pt verbalizes understanding.  Plan follow up in 1 month to evaluate progress.   20 minutes spent with pt today.  >50% of this time was spent counseling pt on anxiety.

## 2013-07-16 NOTE — Progress Notes (Signed)
Pre visit review using our clinic review tool, if applicable. No additional management support is needed unless otherwise documented below in the visit note. 

## 2013-07-16 NOTE — Patient Instructions (Signed)
Start lexapro. Follow up in 2 weeks.

## 2013-07-25 ENCOUNTER — Telehealth: Payer: Self-pay | Admitting: Family

## 2013-07-25 NOTE — Telephone Encounter (Signed)
Left message for pt to return my call.

## 2013-07-25 NOTE — Telephone Encounter (Signed)
Received request from [employer] re: request for medical records re: disability.  Please notify and request his permission for [place] to send his most recent office notes to them.

## 2013-07-27 NOTE — Telephone Encounter (Signed)
Spoke with pt and he gave verbal consent to release records from 06/06/13 to present as requested by [facility]. Pt scheduled f/u for 07/31/13. Records faxed to [phone].

## 2013-07-31 ENCOUNTER — Encounter: Payer: Self-pay | Admitting: Family

## 2013-07-31 ENCOUNTER — Ambulatory Visit (INDEPENDENT_AMBULATORY_CARE_PROVIDER_SITE_OTHER): Payer: Managed Care, Other (non HMO) | Admitting: Family

## 2013-07-31 VITALS — BP 110/74 | HR 92 | Temp 98.5°F | Resp 16 | Ht 69.0 in | Wt 216.1 lb

## 2013-07-31 DIAGNOSIS — F411 Generalized anxiety disorder: Secondary | ICD-10-CM

## 2013-07-31 DIAGNOSIS — R634 Abnormal weight loss: Secondary | ICD-10-CM

## 2013-07-31 LAB — HEPATIC FUNCTION PANEL
ALK PHOS: 68 U/L (ref 39–117)
ALT: 76 U/L — ABNORMAL HIGH (ref 0–53)
AST: 72 U/L — ABNORMAL HIGH (ref 0–37)
Albumin: 3.7 g/dL (ref 3.5–5.2)
BILIRUBIN INDIRECT: 0.7 mg/dL (ref 0.2–1.2)
Bilirubin, Direct: 0.2 mg/dL (ref 0.0–0.3)
Total Bilirubin: 0.9 mg/dL (ref 0.2–1.2)
Total Protein: 6.5 g/dL (ref 6.0–8.3)

## 2013-07-31 LAB — BASIC METABOLIC PANEL
BUN: 19 mg/dL (ref 6–23)
CALCIUM: 8.5 mg/dL (ref 8.4–10.5)
CHLORIDE: 101 meq/L (ref 96–112)
CO2: 21 mEq/L (ref 19–32)
Creat: 0.74 mg/dL (ref 0.50–1.35)
Glucose, Bld: 88 mg/dL (ref 70–99)
Potassium: 4 mEq/L (ref 3.5–5.3)
SODIUM: 132 meq/L — AB (ref 135–145)

## 2013-07-31 LAB — TSH: TSH: 0.799 u[IU]/mL (ref 0.350–4.500)

## 2013-07-31 NOTE — Progress Notes (Signed)
Subjective:    Patient ID: Drew Harris, male    DOB: 01-25-1956, 58 y.o.   MRN: [id number]  HPI  [name] is a 58 yr old male who presents today for follow up of his anxiety. Last visit he noted ongoing anxiety at work.   Reported sleeping poorly, having panic attacks prior to work. He was started on lexapro last visit. Does not feel ready to return to work.  Wife notes that he has been talking in his sleep about "feeling alone" and not liking his work.    Wt Readings from Last 3 Encounters:  07/31/13 216 lb 1.3 oz (98.013 kg)  07/16/13 224 lb 1.3 oz (101.642 kg)  06/11/13 223 lb (101.152 kg)   He reports + dry heaving x 1 week.  Denies abdominal pain or nausea. Reports that his appetite is improving.  Denies associated side effects from the lexapro.  Denies associated fevers.  Sleep is "so so."   Reports that yesterday he ate scrambled eggs, bacon, hamburger and hot dog.  Denied nausea, abdominal pain. Tolerating water.    Review of Systems See HPI      Past Medical History  Diagnosis Date  . Allergic rhinitis   . Hyperlipidemia   . Borderline diabetes     Type 2  . Hypertension   . ED (erectile dysfunction)     History   Social History  . Marital Status: Single    Spouse Name: N/A    Number of Children: N/A  . Years of Education: N/A   Occupational History  . Not on file.   Social History Main Topics  . Smoking status: Former Research scientist (life sciences)  . Smokeless tobacco: Never Used  . Alcohol Use: Yes  . Drug Use: No  . Sexual Activity: Not on file   Other Topics Concern  . 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 Appendectomy      Family History  Problem Relation Age of Onset  . Hypertension Mother   . Prostate cancer Father     deceased age 57 secondary to prostate cancer  . Diabetes type II Sister     Allergies  Allergen Reactions  .  Ace Inhibitors     Current Outpatient Prescriptions on File Prior to Visit  Medication Sig Dispense Refill  . amLODipine (NORVASC) 10 MG tablet TAKE 1 TABLET BY MOUTH EVERY DAY  90 tablet  1  . aspirin EC 81 MG tablet Take 81 mg by mouth daily.      Marland Kitchen escitalopram (LEXAPRO) 10 MG tablet 1/2 tab by mouth once daily for 1 week, then increase to a full tab once daily  30 tablet  0  . metFORMIN (GLUCOPHAGE-XR) 500 MG 24 hr tablet Take 1 tablet (500 mg total) by mouth daily.  90 tablet  1  . olmesartan (BENICAR) 40 MG tablet TAKE 1 TABLET BY MOUTH EVERY DAY  90 tablet  1  . simvastatin (ZOCOR) 20 MG tablet Take 1 tablet (20 mg total) by mouth at bedtime.  90 tablet  1  . tadalafil (CIALIS) 5 MG tablet Take 1 tablet (5 mg total) by mouth daily as needed.  10 tablet  3   No current facility-administered medications on file prior to visit.    BP 110/74  Pulse 92  Temp(Src) 98.5 F (36.9 C) (  Oral)  Resp 16  Ht 5\' 9"  (1.753 m)  Wt 216 lb 1.3 oz (98.013 kg)  BMI 31.89 kg/m2  SpO2 97%     Objective:   Physical Exam  Constitutional: He is oriented to person, place, and time. He appears well-developed and well-nourished. No distress.  Cardiovascular: Normal rate and regular rhythm.   No murmur heard. Pulmonary/Chest: Effort normal and breath sounds normal. No respiratory distress. He has no wheezes. He has no rales. He exhibits no tenderness.  Abdominal: Soft. Bowel sounds are normal. He exhibits no distension. There is no tenderness. There is no rebound and no guarding.  Musculoskeletal: He exhibits no edema.  Neurological: He is alert and oriented to person, place, and time.  Psychiatric: He has a normal mood and affect. His behavior is normal. Judgment normal.          Assessment & Plan:  25 min spent with pt.  >50% of this time was spent counseling pt on anxiety/depression.

## 2013-07-31 NOTE — Progress Notes (Signed)
Pre visit review using our clinic review tool, if applicable. No additional management support is needed unless otherwise documented below in the visit note. 

## 2013-07-31 NOTE — Patient Instructions (Signed)
Please obtain FMLA paperwork from your employer for [place] to fill out. Continue lexapro. Contact a therapist to schedule an appointment. Follow up in 2 weeks.

## 2013-07-31 NOTE — Assessment & Plan Note (Addendum)
No significant improvement yet. Continue current dose of lexapro. Encouraged pt to schedule appt with therapist.  He has had some unintentional weight loss since his last apt. I advised pt to make sure to eat regular meals.  I think GI side effects were stress related/  Monitor. He verbalizes understanding. I don't think he is ready to return to work yet.   I have written him out for 2 more weeks.

## 2013-08-03 ENCOUNTER — Telehealth: Payer: Self-pay | Admitting: Family

## 2013-08-03 DIAGNOSIS — R945 Abnormal results of liver function studies: Secondary | ICD-10-CM

## 2013-08-03 DIAGNOSIS — R7989 Other specified abnormal findings of blood chemistry: Secondary | ICD-10-CM

## 2013-08-03 NOTE — Telephone Encounter (Signed)
Liver function testing is elevated.  I would like him to complete abdominal US, and lab work as below.

## 2013-08-03 NOTE — Telephone Encounter (Signed)
Notified pt and he voices understanding. He will complete labs and u/s on the same day. Orders signed.

## 2013-08-04 ENCOUNTER — Ambulatory Visit (HOSPITAL_BASED_OUTPATIENT_CLINIC_OR_DEPARTMENT_OTHER)
Admission: RE | Admit: 2013-08-04 | Discharge: 2013-08-04 | Disposition: A | Discharge status: Home / Self Care | Payer: Managed Care, Other (non HMO) | Source: Ambulatory Visit | Attending: Family | Admitting: Family

## 2013-08-04 ENCOUNTER — Inpatient Hospital Stay (HOSPITAL_BASED_OUTPATIENT_CLINIC_OR_DEPARTMENT_OTHER): Admission: RE | Admit: 2013-08-04 | Payer: Managed Care, Other (non HMO) | Source: Ambulatory Visit

## 2013-08-04 ENCOUNTER — Encounter (INDEPENDENT_AMBULATORY_CARE_PROVIDER_SITE_OTHER): Payer: Self-pay

## 2013-08-04 DIAGNOSIS — R109 Unspecified abdominal pain: Secondary | ICD-10-CM | POA: Insufficient documentation

## 2013-08-04 DIAGNOSIS — R7989 Other specified abnormal findings of blood chemistry: Secondary | ICD-10-CM | POA: Insufficient documentation

## 2013-08-13 ENCOUNTER — Encounter: Payer: Self-pay | Admitting: Family

## 2013-08-13 ENCOUNTER — Other Ambulatory Visit: Payer: Self-pay | Admitting: Family

## 2013-08-13 ENCOUNTER — Ambulatory Visit (INDEPENDENT_AMBULATORY_CARE_PROVIDER_SITE_OTHER): Payer: Managed Care, Other (non HMO) | Admitting: Family

## 2013-08-13 ENCOUNTER — Telehealth: Payer: Self-pay | Admitting: Family

## 2013-08-13 VITALS — BP 126/80 | HR 95 | Temp 98.0°F | Resp 16 | Ht 69.0 in | Wt 212.0 lb

## 2013-08-13 DIAGNOSIS — F411 Generalized anxiety disorder: Secondary | ICD-10-CM

## 2013-08-13 DIAGNOSIS — R112 Nausea with vomiting, unspecified: Secondary | ICD-10-CM

## 2013-08-13 DIAGNOSIS — R634 Abnormal weight loss: Secondary | ICD-10-CM

## 2013-08-13 LAB — IRON AND TIBC
%SAT: 38 % (ref 20–55)
Iron: 100 ug/dL (ref 42–165)
TIBC: 260 ug/dL (ref 215–435)
UIBC: 160 ug/dL (ref 125–400)

## 2013-08-13 LAB — LIPASE: Lipase: 39 U/L (ref 0–75)

## 2013-08-13 LAB — FERRITIN: FERRITIN: 1176 ng/mL — AB (ref 22–322)

## 2013-08-13 MED ORDER — ONDANSETRON 4 MG PO TBDP: 4.0000 mg | ORAL_TABLET | Freq: Three times a day (TID) | ORAL | Status: DC | PRN

## 2013-08-13 MED ORDER — PANTOPRAZOLE SODIUM 40 MG PO TBEC
40.0000 mg | DELAYED_RELEASE_TABLET | Freq: Every day | ORAL | Status: DC
Start: 2013-08-13 — End: 2013-10-08

## 2013-08-13 MED ORDER — ESCITALOPRAM OXALATE 10 MG PO TABS: 10.0000 mg | ORAL_TABLET | Freq: Every day | ORAL | Status: DC

## 2013-08-13 NOTE — Telephone Encounter (Signed)
Refill- escitalopram  90 day supply  [facility]  [address] in [place]

## 2013-08-13 NOTE — Progress Notes (Signed)
Subjective:    Patient ID: Drew Harris, male    DOB: 1956/02/04, 58 y.o.   MRN: [id number]  HPI  Drew Harris is a 58 yr old male who presents today for follow up.  1) Anxiety- he was last seen on 07/31/13.  At that time wife noted that pt was "talking in his sleep about feeling alone and not liking his work.  He was continued on lexapro and encouraged to schedule an appointment with a therapist. Reports that his anxiety is improved.  Due to his GI complaints, he has not had a chance to schedule with a therapist.   2) Nausea/Weight loss- had an abdominal US on 5/29 which noted no cholelithiasis or sonographic evidence of acute cholecystitis.  Reports that that he continues to dry heave intermittently. Denies chest pain.  He reports that the nausea preceded lexapro.  Reports that appetite is poor. Wife notes that he has been tolerating small amounts of food.  Wt Readings from Last 3 Encounters:  08/13/13 212 lb (96.163 kg)  07/31/13 216 lb 1.3 oz (98.013 kg)  07/16/13 224 lb 1.3 oz (101.642 kg)    Review of Systems See HPI  Past Medical History  Diagnosis Date  . Allergic rhinitis   . Hyperlipidemia   . Borderline diabetes     Type 2  . Hypertension   . ED (erectile dysfunction)     History   Social History  . Marital Status: Single    Spouse Name: N/A    Number of Children: N/A  . Years of Education: N/A   Occupational History  . Not on file.   Social History Main Topics  . Smoking status: Former Research scientist (life sciences)  . Smokeless tobacco: Never Used  . Alcohol Use: Yes  . Drug Use: No  . Sexual Activity: Not on file   Other Topics Concern  . 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 Appendectomy      Family History  Problem Relation Age of Onset  . Hypertension Mother   . Prostate cancer Father     deceased age 53 secondary to prostate cancer  .  Diabetes type II Sister     Allergies  Allergen Reactions  . Ace Inhibitors     Current Outpatient Prescriptions on File Prior to Visit  Medication Sig Dispense Refill  . amLODipine (NORVASC) 10 MG tablet TAKE 1 TABLET BY MOUTH EVERY DAY  90 tablet  1  . aspirin EC 81 MG tablet Take 81 mg by mouth daily.      Marland Kitchen escitalopram (LEXAPRO) 10 MG tablet 1/2 tab by mouth once daily for 1 week, then increase to a full tab once daily  30 tablet  0  . metFORMIN (GLUCOPHAGE-XR) 500 MG 24 hr tablet Take 1 tablet (500 mg total) by mouth daily.  90 tablet  1  . olmesartan (BENICAR) 40 MG tablet TAKE 1 TABLET BY MOUTH EVERY DAY  90 tablet  1  . simvastatin (ZOCOR) 20 MG tablet Take 1 tablet (20 mg total) by mouth at bedtime.  90 tablet  1  . tadalafil (CIALIS) 5 MG tablet Take 1 tablet (5 mg total) by mouth daily as needed.  10 tablet  3   No current facility-administered medications on file prior to visit.    BP 126/80  Pulse 95  Temp(Src) 98 F (36.7 C) (Oral)  Resp 16  Ht 5\' 9"  (1.753 m)  Wt 212 lb (96.163 kg)  BMI 31.29 kg/m2  SpO2 98%       Objective:   Physical Exam  Constitutional: He is oriented to person, place, and time. He appears well-developed and well-nourished. No distress.  Cardiovascular: Normal rate and regular rhythm.   No murmur heard. Pulmonary/Chest: Effort normal and breath sounds normal. No respiratory distress. He has no wheezes. He has no rales. He exhibits no tenderness.  Abdominal: Soft. There is tenderness in the epigastric area.  Musculoskeletal: He exhibits no edema.  Neurological: He is alert and oriented to person, place, and time.  Psychiatric: He has a normal mood and affect. His behavior is normal. Judgment and thought content normal.          Assessment & Plan:

## 2013-08-13 NOTE — Telephone Encounter (Signed)
Refill sent.

## 2013-08-13 NOTE — Progress Notes (Signed)
Pre visit review using our clinic review tool, if applicable. No additional management support is needed unless otherwise documented below in the visit note. 

## 2013-08-13 NOTE — Assessment & Plan Note (Signed)
+   epigastric tenderness.  Obtain lipase. Due to weight loss. Will pan CT.  Add empiric PPI, obtain H. Pylori Ab.  Prn zofran.  Note provided for work. Follow up in 2 weeks.

## 2013-08-13 NOTE — Assessment & Plan Note (Signed)
Improving.  Continue lexapro.

## 2013-08-13 NOTE — Patient Instructions (Addendum)
Complete lab work prior to leaving. You will be contacted about CT scans. You will be contacted about you referral to gastroenterology. Start protonix once daily for acid. Start zofran as needed for nausea.  Follow up in 2 weeks. Go to ER if unable to keep down foot/liquid or if you develop severe abdominal pain.

## 2013-08-14 LAB — HEPATITIS C ANTIBODY: HCV AB: NEGATIVE

## 2013-08-14 LAB — H. PYLORI ANTIBODY, IGG: H PYLORI IGG: 5.13 {ISR} — AB

## 2013-08-14 LAB — HEPATITIS B SURFACE ANTIGEN: Hepatitis B Surface Ag: NEGATIVE

## 2013-08-14 LAB — HEPATITIS B SURFACE ANTIBODY,QUALITATIVE: Hep B S Ab: NEGATIVE

## 2013-08-15 ENCOUNTER — Encounter (HOSPITAL_BASED_OUTPATIENT_CLINIC_OR_DEPARTMENT_OTHER): Payer: Self-pay

## 2013-08-15 ENCOUNTER — Ambulatory Visit (HOSPITAL_BASED_OUTPATIENT_CLINIC_OR_DEPARTMENT_OTHER)
Admission: RE | Admit: 2013-08-15 | Discharge: 2013-08-15 | Disposition: A | Discharge status: Home / Self Care | Payer: Managed Care, Other (non HMO) | Source: Ambulatory Visit | Attending: Family | Admitting: Family

## 2013-08-15 ENCOUNTER — Other Ambulatory Visit: Payer: Self-pay | Admitting: Family

## 2013-08-15 DIAGNOSIS — R112 Nausea with vomiting, unspecified: Secondary | ICD-10-CM

## 2013-08-15 DIAGNOSIS — R634 Abnormal weight loss: Secondary | ICD-10-CM

## 2013-08-15 DIAGNOSIS — Q619 Cystic kidney disease, unspecified: Secondary | ICD-10-CM | POA: Insufficient documentation

## 2013-08-15 DIAGNOSIS — M5126 Other intervertebral disc displacement, lumbar region: Secondary | ICD-10-CM | POA: Insufficient documentation

## 2013-08-15 DIAGNOSIS — K5730 Diverticulosis of large intestine without perforation or abscess without bleeding: Secondary | ICD-10-CM | POA: Insufficient documentation

## 2013-08-15 DIAGNOSIS — K4090 Unilateral inguinal hernia, without obstruction or gangrene, not specified as recurrent: Secondary | ICD-10-CM | POA: Insufficient documentation

## 2013-08-15 DIAGNOSIS — K7689 Other specified diseases of liver: Secondary | ICD-10-CM | POA: Insufficient documentation

## 2013-08-15 DIAGNOSIS — R7989 Other specified abnormal findings of blood chemistry: Secondary | ICD-10-CM

## 2013-08-15 HISTORY — DX: Type 2 diabetes mellitus without complications: E11.9

## 2013-08-15 LAB — CERULOPLASMIN: Ceruloplasmin: 18 mg/dL (ref 18–36)

## 2013-08-15 MED ORDER — IOHEXOL 300 MG/ML  SOLN
100.0000 mL | Freq: Once | INTRAMUSCULAR | Status: AC | PRN
  Administered 2013-08-15: 100 mL via INTRAVENOUS

## 2013-08-15 NOTE — Telephone Encounter (Signed)
Please contact pt and let him know that one of his iron studies is elevated.  I would like him to complete 1 additional blood test (pended below) to see if he has a genetic mutation which is causing the elevated iron level.  Hepatitis testing is negative. CT abdomen is unremarkable except for fatty liver (can be helped by low fat diet, exercise, weight loss) bulging disc L4-5 and a small hernia in the right groin. CT chest is negative.  H. Pylori blood test is +. This can sometimes lead to ulcers in his stomach.  Given his symptoms, I would recommend that we start him on some medications to treat h. Pylori and that he meet with GI.  He should be contacted about appointment with them soon.    For H. Pylori treatment he should start clarithromycin and amoxicillin- pended below- and then increase the protonix that we gave him to bid for 10 days, then can return to once daily.

## 2013-08-17 MED ORDER — CLARITHROMYCIN 500 MG PO TABS
500.0000 mg | ORAL_TABLET | Freq: Two times a day (BID) | ORAL | Status: DC
Start: ? — End: 2013-10-08

## 2013-08-17 MED ORDER — AMOXICILLIN 500 MG PO CAPS: 1000.0000 mg | ORAL_CAPSULE | Freq: Two times a day (BID) | ORAL | Status: DC

## 2013-08-17 NOTE — Telephone Encounter (Signed)
Notified pt and he voices understanding. Copy of below instructions mailed to pt.

## 2013-08-17 NOTE — Telephone Encounter (Signed)
Left message for pt to return my call.

## 2013-08-20 ENCOUNTER — Encounter: Payer: Self-pay | Admitting: Gastroenterology

## 2013-08-24 LAB — HEMOCHROMATOSIS DNA-PCR(C282Y,H63D)

## 2013-08-27 ENCOUNTER — Telehealth: Payer: Self-pay | Admitting: Family

## 2013-08-27 ENCOUNTER — Encounter: Payer: Self-pay | Admitting: Family

## 2013-08-27 ENCOUNTER — Ambulatory Visit (INDEPENDENT_AMBULATORY_CARE_PROVIDER_SITE_OTHER): Payer: Managed Care, Other (non HMO) | Admitting: Family

## 2013-08-27 VITALS — BP 130/80 | HR 109 | Temp 97.6°F | Ht 69.0 in | Wt 212.0 lb

## 2013-08-27 DIAGNOSIS — R112 Nausea with vomiting, unspecified: Secondary | ICD-10-CM

## 2013-08-27 DIAGNOSIS — F411 Generalized anxiety disorder: Secondary | ICD-10-CM

## 2013-08-27 DIAGNOSIS — R7989 Other specified abnormal findings of blood chemistry: Secondary | ICD-10-CM

## 2013-08-27 NOTE — Progress Notes (Signed)
Subjective:    Patient ID: Drew Harris, male    DOB: 1955/09/02, 58 y.o.   MRN: [id number]  HPI  Drew Harris is a 58 yr old male who presents today for follow up.   1) anxiety- Pt is continued on citalopram. Notes that his overall mood is better.  Reports that he is less anxious. Feels ready to get back to work.   2) Nausea/weight loss- H. Pylori testing was positive. He was placed on triple therapy with PPI.  Reports that his nausea has improved.  Reports improved appetite. Weight has been stable. Scheduled to see GI in August.  Wt Readings from Last 3 Encounters:  08/27/13 212 lb (96.163 kg)  08/13/13 212 lb (96.163 kg)  07/31/13 216 lb 1.3 oz (98.013 kg)   3) Elevated Ferritin- noted last visit on blood draw. His hemochromatosis DNA analysis was negative for mutation.   Review of Systems See HPI  Past Medical History  Diagnosis Date  . Allergic rhinitis   . Hyperlipidemia   . Borderline diabetes     Type 2  . Hypertension   . ED (erectile dysfunction)   . Diabetes mellitus without complication     History   Social History  . Marital Status: Single    Spouse Name: N/A    Number of Children: N/A  . Years of Education: N/A   Occupational History  . Not on file.   Social History Main Topics  . Smoking status: Former Research scientist (life sciences)  . Smokeless tobacco: Never Used  . Alcohol Use: Yes  . Drug Use: No  . Sexual Activity: Not on file   Other Topics Concern  . 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 Appendectomy      Family History  Problem Relation Age of Onset  . Hypertension Mother   . Prostate cancer Father     deceased age 52 secondary to prostate cancer  . Diabetes type II Sister     Allergies  Allergen Reactions  . Ace Inhibitors     Current Outpatient Prescriptions on File Prior to Visit  Medication Sig Dispense Refill  .  amLODipine (NORVASC) 10 MG tablet TAKE 1 TABLET BY MOUTH EVERY DAY  90 tablet  1  . amoxicillin (AMOXIL) 500 MG capsule Take 2 capsules (1,000 mg total) by mouth 2 (two) times daily.  40 capsule  0  . aspirin EC 81 MG tablet Take 81 mg by mouth daily.      . clarithromycin (BIAXIN) 500 MG tablet Take 1 tablet (500 mg total) by mouth 2 (two) times daily.  20 tablet  0  . escitalopram (LEXAPRO) 10 MG tablet Take 1 tablet (10 mg total) by mouth daily.  90 tablet  0  . metFORMIN (GLUCOPHAGE-XR) 500 MG 24 hr tablet Take 1 tablet (500 mg total) by mouth daily.  90 tablet  1  . olmesartan (BENICAR) 40 MG tablet TAKE 1 TABLET BY MOUTH EVERY DAY  90 tablet  1  . ondansetron (ZOFRAN ODT) 4 MG disintegrating tablet Take 1 tablet (4 mg total) by mouth every 8 (eight) hours as needed for nausea or vomiting.  20 tablet  0  . pantoprazole (PROTONIX) 40 MG tablet Take 1 tablet (40 mg total) by mouth daily.  30 tablet  3  . simvastatin (ZOCOR) 20 MG tablet  Take 1 tablet (20 mg total) by mouth at bedtime.  90 tablet  1  . tadalafil (CIALIS) 5 MG tablet Take 1 tablet (5 mg total) by mouth daily as needed.  10 tablet  3   No current facility-administered medications on file prior to visit.    BP 130/80  Pulse 109  Temp(Src) 97.6 F (36.4 C) (Oral)  Ht 5\' 9"  (1.753 m)  Wt 212 lb (96.163 kg)  BMI 31.29 kg/m2  SpO2 96%       Objective:   Physical Exam  Constitutional: He appears well-developed and well-nourished. No distress.  HENT:  Head: Normocephalic and atraumatic.  Cardiovascular: Normal rate and regular rhythm.   No murmur heard. Pulmonary/Chest: Effort normal and breath sounds normal.  Abdominal: Soft. Bowel sounds are normal. He exhibits no distension and no mass. There is no tenderness. There is no rebound and no guarding.  Musculoskeletal: He exhibits no edema.  Neurological: He is alert.  Psychiatric: He has a normal mood and affect. His behavior is normal. Judgment and thought content  normal.          Assessment & Plan:

## 2013-08-27 NOTE — Assessment & Plan Note (Addendum)
Hemochromatosis work up is negative. ? Acute phase reactant to h. Pylori?  Plan to repeat in about 2 weeks. If still significantly elevated will plan referral to hematology.

## 2013-08-27 NOTE — Telephone Encounter (Signed)
Opened in error

## 2013-08-27 NOTE — Patient Instructions (Addendum)
Please follow up in 2 months, sooner if problems or concerns. Keep upcoming appointment with GI.

## 2013-08-27 NOTE — Assessment & Plan Note (Signed)
Suspect secondary to H. Pylori/gastritis. Improving. Complete abx. Advised pt to continue protonix after completion of abx.

## 2013-08-27 NOTE — Assessment & Plan Note (Signed)
Improved. Continue citalopram.

## 2013-08-27 NOTE — Telephone Encounter (Signed)
Message copied by Debbrah Alar on Mon Aug 27, 2013  7:50 PM ------      Message from: Penni Homans A      Created: Mon Aug 27, 2013  7:08 PM       Ferritin is also an acute phase reactant so with the positive H Pylori would repeat ferritin 1-2 weeks after treatment of H pylori is complete. If still significantly elevated then would refer to hematology       ----- Message -----         From: Debbrah Alar, NP         Sent: 08/27/2013   8:39 AM           To: Mosie Lukes, MD            Please see pt's ferritin level.  Hemachromatosis DNA mutation testing is negative. Would you do anything else? Do you think I should send for heme consult?       ------

## 2013-08-27 NOTE — Progress Notes (Signed)
Pre visit review using our clinic review tool, if applicable. No additional management support is needed unless otherwise documented below in the visit note. 

## 2013-08-29 NOTE — Telephone Encounter (Signed)
Please contact pt and let him know that I have the medical records available for pick up at the front desk.  I checked and we have not received the [facility] paperwork. Please drop off copy and I will complete.  I would like for him to repeat one of the iron tests in 2 weeks. (pended below)

## 2013-08-29 NOTE — Telephone Encounter (Signed)
LMOVM for pt to return call 

## 2013-08-30 NOTE — Telephone Encounter (Signed)
I did call his insurance yesterday and request copy of FMLA, did we receive it?

## 2013-08-31 NOTE — Telephone Encounter (Signed)
Lets contact pt and ask him to bring [place] a copy since we have not been successful in obtaining from his insurance.

## 2013-08-31 NOTE — Telephone Encounter (Signed)
I was not here yesterday but Marj said that she did not see any paperwork for this patient

## 2013-08-31 NOTE — Telephone Encounter (Signed)
Left message to return my call and let us know if we can leave him a detailed message on voicemail.

## 2013-09-04 ENCOUNTER — Telehealth: Payer: Self-pay | Admitting: *Deleted

## 2013-09-04 NOTE — Telephone Encounter (Signed)
Left message for pt to return my call and let us know if we can leave him a detailed message.

## 2013-09-04 NOTE — Telephone Encounter (Signed)
Late documentation:  Received request from [facility] re: pt's disability claim. They are requesting office notes / results from 06/06/13 through [employer]. Letter and office notes faxed to [phone] or 07/27/13.

## 2013-09-06 ENCOUNTER — Telehealth: Payer: Self-pay | Admitting: Family

## 2013-09-06 NOTE — Telephone Encounter (Signed)
Please contact pt and let him know that we do not have his FMLA paperwork and need him to provide that to [place].  Will be happy to fill out.

## 2013-09-06 NOTE — Telephone Encounter (Signed)
Patients fiance left message stating the patient was out of work 5/11- 6/22 and he needs his FMLA to state this, call patient @ [phone] with questions

## 2013-09-06 NOTE — Telephone Encounter (Signed)
See phone note 7/2.

## 2013-09-10 NOTE — Telephone Encounter (Signed)
Left message for pt to return my call.

## 2013-09-10 NOTE — Telephone Encounter (Signed)
FMLA forms received and forwarded to Provider for completion.

## 2013-09-11 NOTE — Telephone Encounter (Signed)
Patient returned phone call. He is requesting that we fax forms when completed

## 2013-09-11 NOTE — Telephone Encounter (Signed)
Left message for pt to return my call.

## 2013-09-11 NOTE — Telephone Encounter (Signed)
FMLA is complete.

## 2013-09-12 NOTE — Telephone Encounter (Signed)
Informed patient of this and he wanted [place] to fax forms.

## 2013-09-13 NOTE — Telephone Encounter (Signed)
Marj,  Did you fax these? Thanks!

## 2013-09-13 NOTE — Telephone Encounter (Signed)
Were these faxed?

## 2013-09-14 NOTE — Telephone Encounter (Signed)
FMLA faxed to [facility] at [phone]. Still need to notify pt of need to repeat ferritin level. Left message for pt to return my call.

## 2013-09-17 ENCOUNTER — Ambulatory Visit: Payer: Managed Care, Other (non HMO) | Admitting: Family

## 2013-09-17 NOTE — Telephone Encounter (Signed)
Notified pt or need to repeat ferritin. Pt states he will try to get by the lab by the first of next week. Order signed.

## 2013-09-25 ENCOUNTER — Other Ambulatory Visit: Payer: Self-pay | Admitting: Family

## 2013-09-25 LAB — FERRITIN: FERRITIN: 908 ng/mL — AB (ref 22–322)

## 2013-09-26 ENCOUNTER — Telehealth: Payer: Self-pay

## 2013-09-26 DIAGNOSIS — R7989 Other specified abnormal findings of blood chemistry: Secondary | ICD-10-CM

## 2013-09-26 NOTE — Telephone Encounter (Signed)
Notified pt and he will try to return by the end of this week. Lab order entered.

## 2013-09-26 NOTE — Telephone Encounter (Signed)
Please let pt know that iron level is elevated.  I would like to have him return for CBC with diff at his convenience. Dx elevated ferritin.

## 2013-09-26 NOTE — Telephone Encounter (Signed)
Received message from phlebotomist that CBC cannot be added to recent blood draw because lavender tube was not drawn. Please advise.

## 2013-09-26 NOTE — Telephone Encounter (Signed)
Msg was related to lab.Tech stated she would add on test as requested.(CBC W/ Diff)

## 2013-09-26 NOTE — Addendum Note (Signed)
Addended by: Kelle Darting A on: 09/26/2013 05:40 PM   Modules accepted: Orders

## 2013-09-27 LAB — CBC WITH DIFFERENTIAL/PLATELET
BASOS PCT: 0 % (ref 0–1)
Basophils Absolute: 0 10*3/uL (ref 0.0–0.1)
EOS ABS: 0 10*3/uL (ref 0.0–0.7)
Eosinophils Relative: 1 % (ref 0–5)
HCT: 35.4 % — ABNORMAL LOW (ref 39.0–52.0)
HEMOGLOBIN: 12.2 g/dL — AB (ref 13.0–17.0)
Lymphocytes Relative: 45 % (ref 12–46)
Lymphs Abs: 1.8 10*3/uL (ref 0.7–4.0)
MCH: 34.2 pg — AB (ref 26.0–34.0)
MCHC: 34.5 g/dL (ref 30.0–36.0)
MCV: 99.2 fL (ref 78.0–100.0)
Monocytes Absolute: 0.5 10*3/uL (ref 0.1–1.0)
Monocytes Relative: 12 % (ref 3–12)
NEUTROS ABS: 1.6 10*3/uL — AB (ref 1.7–7.7)
Neutrophils Relative %: 42 % — ABNORMAL LOW (ref 43–77)
PLATELETS: 283 10*3/uL (ref 150–400)
RBC: 3.57 MIL/uL — AB (ref 4.22–5.81)
RDW: 15 % (ref 11.5–15.5)
WBC: 3.9 10*3/uL — ABNORMAL LOW (ref 4.0–10.5)

## 2013-09-28 LAB — B12 AND FOLATE PANEL
FOLATE: 4.4 ng/mL
VITAMIN B 12: 379 pg/mL (ref 211–911)

## 2013-09-28 NOTE — Telephone Encounter (Signed)
OK to just do b12 at this time. Thanks.

## 2013-09-28 NOTE — Telephone Encounter (Signed)
I sent add on info to the lab, they stated that they can add on a b12 lab but would not be able to add on a folate.  They would have to re-stick the pt again

## 2013-10-01 ENCOUNTER — Telehealth: Payer: Self-pay | Admitting: *Deleted

## 2013-10-01 DIAGNOSIS — D649 Anemia, unspecified: Secondary | ICD-10-CM

## 2013-10-01 NOTE — Telephone Encounter (Signed)
Message copied by Ronny Flurry on Mon Oct 01, 2013  9:55 AM ------      Message from: O'SULLIVAN, MELISSA      Created: Mon Oct 01, 2013  7:12 AM       Please let pt know that his b12 and folate are normal.  He is mildly anemic. I would like him to complete IFOB please (dx anemia). ------

## 2013-10-01 NOTE — Telephone Encounter (Signed)
Left detailed message on pt's cell# and placed IFOB kit at front desk for pick up and to call if any questions. Lab order placed.

## 2013-10-08 ENCOUNTER — Encounter: Payer: Self-pay | Admitting: Family

## 2013-10-08 ENCOUNTER — Ambulatory Visit (INDEPENDENT_AMBULATORY_CARE_PROVIDER_SITE_OTHER): Payer: Managed Care, Other (non HMO) | Admitting: Family

## 2013-10-08 VITALS — BP 126/84 | HR 93 | Temp 98.2°F | Resp 16 | Ht 69.0 in | Wt 217.0 lb

## 2013-10-08 DIAGNOSIS — D649 Anemia, unspecified: Secondary | ICD-10-CM

## 2013-10-08 DIAGNOSIS — R112 Nausea with vomiting, unspecified: Secondary | ICD-10-CM

## 2013-10-08 MED ORDER — OLMESARTAN MEDOXOMIL 40 MG PO TABS: ORAL_TABLET | ORAL | Status: DC

## 2013-10-08 MED ORDER — ESCITALOPRAM OXALATE 10 MG PO TABS: 10.0000 mg | ORAL_TABLET | Freq: Every day | ORAL | Status: DC

## 2013-10-08 MED ORDER — AMLODIPINE BESYLATE 10 MG PO TABS: ORAL_TABLET | ORAL | Status: DC

## 2013-10-08 MED ORDER — SIMVASTATIN 20 MG PO TABS: 20.0000 mg | ORAL_TABLET | Freq: Every day | ORAL | Status: DC

## 2013-10-08 MED ORDER — METFORMIN HCL ER 500 MG PO TB24: 500.0000 mg | ORAL_TABLET | Freq: Every day | ORAL | Status: DC

## 2013-10-08 MED ORDER — TADALAFIL 5 MG PO TABS: 5.0000 mg | ORAL_TABLET | Freq: Every day | ORAL | Status: DC | PRN

## 2013-10-08 MED ORDER — PANTOPRAZOLE SODIUM 40 MG PO TBEC: 40.0000 mg | DELAYED_RELEASE_TABLET | Freq: Every day | ORAL | Status: DC

## 2013-10-08 NOTE — Progress Notes (Signed)
Subjective:    Patient ID: Drew Harris, male    DOB: [birth year], 58 y.o.   MRN: [id number]  HPI  Drew Harris is a 58 yr old male who presents today for follow up.  Anxiety- He was last seen back on 6/22. At that time he was anxious to return to work and noted mood was good.  Reports that work is going well, anxiety well controlled on escitalopram. Denies depression, sleeping well.   Nausea weight loss- + H pylori, treated with PPI.  Denies abdominal pain or nausea, appetite is improved.   Wt Readings from Last 3 Encounters:  10/08/13 217 lb (98.431 kg)  08/27/13 212 lb (96.163 kg)  08/13/13 212 lb (96.163 kg)   Anemia- work up ongoing. He will complete iFOB kit and return.  Ferritin remains persistently elevated.  B12 and folate were normal.    Review of Systems See HPI  Past Medical History  Diagnosis Date  . Allergic rhinitis   . Hyperlipidemia   . Borderline diabetes     Type 2  . Hypertension   . ED (erectile dysfunction)   . Diabetes mellitus without complication     History   Social History  . Marital Status: Single    Spouse Name: N/A    Number of Children: N/A  . Years of Education: N/A   Occupational History  . Not on file.   Social History Main Topics  . Smoking status: Current Some Day Smoker  . Smokeless tobacco: Never Used     Comment: Pt somkes occasionally  . Alcohol Use: Yes  . Drug Use: No  . Sexual Activity: Not on file   Other Topics Concern  . 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 Appendectomy      Family History  Problem Relation Age of Onset  . Hypertension Mother   . Prostate cancer Father     deceased age 2 secondary to prostate cancer  . Diabetes type II Sister     Allergies  Allergen Reactions  . Ace Inhibitors     Current Outpatient Prescriptions on File Prior to Visit  Medication Sig  Dispense Refill  . amLODipine (NORVASC) 10 MG tablet TAKE 1 TABLET BY MOUTH EVERY DAY  90 tablet  1  . aspirin EC 81 MG tablet Take 81 mg by mouth daily.      Marland Kitchen escitalopram (LEXAPRO) 10 MG tablet Take 1 tablet (10 mg total) by mouth daily.  90 tablet  0  . metFORMIN (GLUCOPHAGE-XR) 500 MG 24 hr tablet Take 1 tablet (500 mg total) by mouth daily.  90 tablet  1  . olmesartan (BENICAR) 40 MG tablet TAKE 1 TABLET BY MOUTH EVERY DAY  90 tablet  1  . ondansetron (ZOFRAN ODT) 4 MG disintegrating tablet Take 1 tablet (4 mg total) by mouth every 8 (eight) hours as needed for nausea or vomiting.  20 tablet  0  . pantoprazole (PROTONIX) 40 MG tablet Take 1 tablet (40 mg total) by mouth daily.  30 tablet  3  . simvastatin (ZOCOR) 20 MG tablet Take 1 tablet (20 mg total) by mouth at bedtime.  90 tablet  1  . tadalafil (CIALIS) 5 MG tablet Take 1 tablet (5 mg total) by mouth daily as needed.  10 tablet  3   No current  facility-administered medications on file prior to visit.    BP 126/84  Pulse 93  Temp(Src) 98.2 F (36.8 C) (Oral)  Resp 16  Ht '5\' 9"'  (1.753 m)  Wt 217 lb (98.431 kg)  BMI 32.03 kg/m2  SpO2 99%       Objective:   Physical Exam  Constitutional: He is oriented to person, place, and time. He appears well-developed and well-nourished. No distress.  HENT:  Head: Normocephalic and atraumatic.  Neurological: He is alert and oriented to person, place, and time.  Psychiatric: He has a normal mood and affect. His behavior is normal. Judgment and thought content normal.          Assessment & Plan:

## 2013-10-08 NOTE — Assessment & Plan Note (Signed)
Appetite has returned and abdominal pain is resolved following h pylori treatment.  Continue PPI. Advised pt OK to cancel GI appointment.

## 2013-10-08 NOTE — Assessment & Plan Note (Signed)
Await IFOB.

## 2013-10-08 NOTE — Patient Instructions (Addendum)
Please call to cancel your appointment with GI on 8/[phone]  Follow up in 4 months.

## 2013-10-08 NOTE — Progress Notes (Signed)
Pre visit review using our clinic review tool, if applicable. No additional management support is needed unless otherwise documented below in the visit note. 

## 2013-10-09 ENCOUNTER — Telehealth: Payer: Self-pay | Admitting: Family

## 2013-10-09 DIAGNOSIS — R7989 Other specified abnormal findings of blood chemistry: Secondary | ICD-10-CM

## 2013-10-09 NOTE — Telephone Encounter (Signed)
Please contact pt and let him know that one of his iron studies remains elevated.  I would like for him to meet with hematology and I have pended referral.

## 2013-10-09 NOTE — Telephone Encounter (Signed)
Notified pt. He states that he has not repeated blood test yet. States we gave him a lab order to complete this week but he has not done so yet.  I do not see a pending order in EPIC.  What was he supposed to repeat and he wants to know what test we are referring to?

## 2013-10-09 NOTE — Telephone Encounter (Signed)
I am basing my recommendation on the ferritin level drawn 2 weeks ago.  No additional lab order at this time.  Pended referral to hematology.

## 2013-10-10 NOTE — Telephone Encounter (Signed)
Left detailed message on pt's cell# re: below recommendation and that we see no further testing ordered in EPIC. Provider would like to proceed with referral and left message for pt to call and let us know if he is agreeable.

## 2013-10-15 ENCOUNTER — Telehealth: Payer: Self-pay | Admitting: *Deleted

## 2013-10-15 NOTE — Telephone Encounter (Signed)
Pt called stating he wants to make sure we have updated his FMLA and dates of form should cover 5/11 through 08/27/13 from his recent medical leave. He wants to make sure that we sent information to [place] pt per previous phone notes 09/04/13 and 09/06/13 that forms were completed with above dates and faxed to [place] pt that we also faxed records from the above dates to [facility].  Will call scanning center tomorrow to get FMLA scanned to [employer] to confirm dates and that they were faxed. Pt requests to be notified once all is complete.

## 2013-10-16 NOTE — Telephone Encounter (Signed)
Left message at [facility] scanning center to check for FMLA papers waiting to be scanned.

## 2013-10-16 NOTE — Telephone Encounter (Signed)
Spoke with Representative with  [facility] [phone]) and confirmed that they received our fax dated 09/14/13 and was approved with requested dates of 07/16/13 through 08/27/13. Original had been placed at front desk for pt pick up?. Original has now been sent to scanning dept. Left detailed message on pt's voicemail re: FMLA completion / approval per [facility].

## 2013-10-16 NOTE — Telephone Encounter (Signed)
Noted  

## 2013-10-16 NOTE — Telephone Encounter (Signed)
Spoke with pt, he states he doesn't want any further workup due to incoming bills from recent testing.

## 2013-10-29 ENCOUNTER — Encounter: Payer: Self-pay | Admitting: Family

## 2013-10-29 ENCOUNTER — Other Ambulatory Visit (INDEPENDENT_AMBULATORY_CARE_PROVIDER_SITE_OTHER): Payer: Managed Care, Other (non HMO)

## 2013-10-29 DIAGNOSIS — D649 Anemia, unspecified: Secondary | ICD-10-CM

## 2013-10-29 LAB — FECAL OCCULT BLOOD, IMMUNOCHEMICAL: Fecal Occult Bld: NEGATIVE

## 2013-10-30 ENCOUNTER — Ambulatory Visit: Payer: Managed Care, Other (non HMO) | Admitting: Gastroenterology

## 2013-11-26 ENCOUNTER — Telehealth: Payer: Self-pay | Admitting: Family

## 2013-11-26 NOTE — Telephone Encounter (Signed)
Please contact pt to schedule visit.  He needs follow up and lab work.

## 2013-11-28 NOTE — Telephone Encounter (Signed)
Left detailed message informing patient to call our office to schedule follow up appointment with labs

## 2013-12-03 NOTE — Telephone Encounter (Signed)
Letter mailed to pt.  

## 2014-02-13 ENCOUNTER — Telehealth: Payer: Self-pay | Admitting: Family

## 2014-02-13 NOTE — Telephone Encounter (Signed)
Caller name:Riddle Sterling Big Relation to QZ:YTMM Call back number:[phone] Pharmacy:  Reason for call: pt states he is returning your call about a lab order.

## 2014-02-14 NOTE — Telephone Encounter (Signed)
Scheduled for follow up appt per notes.

## 2014-02-25 ENCOUNTER — Encounter: Payer: Self-pay | Admitting: Family

## 2014-02-25 ENCOUNTER — Ambulatory Visit (INDEPENDENT_AMBULATORY_CARE_PROVIDER_SITE_OTHER): Payer: Managed Care, Other (non HMO) | Admitting: Family

## 2014-02-25 VITALS — BP 140/88 | HR 87 | Temp 98.0°F | Resp 16 | Ht 69.0 in | Wt 216.8 lb

## 2014-02-25 DIAGNOSIS — R945 Abnormal results of liver function studies: Principal | ICD-10-CM

## 2014-02-25 DIAGNOSIS — R112 Nausea with vomiting, unspecified: Secondary | ICD-10-CM

## 2014-02-25 DIAGNOSIS — I10 Essential (primary) hypertension: Secondary | ICD-10-CM

## 2014-02-25 DIAGNOSIS — R7989 Other specified abnormal findings of blood chemistry: Secondary | ICD-10-CM

## 2014-02-25 DIAGNOSIS — E785 Hyperlipidemia, unspecified: Secondary | ICD-10-CM

## 2014-02-25 DIAGNOSIS — D6489 Other specified anemias: Secondary | ICD-10-CM

## 2014-02-25 DIAGNOSIS — F411 Generalized anxiety disorder: Secondary | ICD-10-CM

## 2014-02-25 MED ORDER — PANTOPRAZOLE SODIUM 40 MG PO TBEC: 40.0000 mg | DELAYED_RELEASE_TABLET | Freq: Every day | ORAL | Status: DC

## 2014-02-25 MED ORDER — OLMESARTAN MEDOXOMIL 40 MG PO TABS: ORAL_TABLET | ORAL | Status: DC

## 2014-02-25 MED ORDER — AMLODIPINE BESYLATE 10 MG PO TABS: ORAL_TABLET | ORAL | Status: DC

## 2014-02-25 MED ORDER — METFORMIN HCL ER 500 MG PO TB24: 500.0000 mg | ORAL_TABLET | Freq: Every day | ORAL | Status: DC

## 2014-02-25 MED ORDER — SIMVASTATIN 20 MG PO TABS: 20.0000 mg | ORAL_TABLET | Freq: Every day | ORAL | Status: DC

## 2014-02-25 MED ORDER — ESCITALOPRAM OXALATE 10 MG PO TABS: 10.0000 mg | ORAL_TABLET | Freq: Every day | ORAL | Status: DC

## 2014-02-25 NOTE — Assessment & Plan Note (Addendum)
Iron is actually elevated.  B12/Folate look ok.  Etiology unclear. Consider referral to hematology if cbc not improved next visit.

## 2014-02-25 NOTE — Progress Notes (Signed)
Pre visit review using our clinic review tool, if applicable. No additional management support is needed unless otherwise documented below in the visit note. 

## 2014-02-25 NOTE — Patient Instructions (Signed)
Please complete lab work prior to leaving. Follow up in 3 months.  

## 2014-02-25 NOTE — Assessment & Plan Note (Signed)
Stable current meds. Continue same, obtain follow up bmet.

## 2014-02-25 NOTE — Assessment & Plan Note (Signed)
Stable on lexapro.  Continue same . 

## 2014-02-25 NOTE — Assessment & Plan Note (Signed)
Tolerating statin, obtain non-fasting lipid panel today.

## 2014-02-25 NOTE — Progress Notes (Signed)
Subjective:    Patient ID: Drew Harris, male    DOB: 12-29-1955, 58 y.o.   MRN: [id number]  HPI  [name] is a 58 yr old male who presents today for follow up of anxiety. He is currently maintained on lexapro 10mg . Reports that he is doing well on this.    Anemia-  IFOB result was negative.  Lab Results  Component Value Date   WBC 3.9* 09/26/2013   HGB 12.2* 09/26/2013   HCT 35.4* 09/26/2013   MCV 99.2 09/26/2013   PLT 283 09/26/2013   Hyperlipidemia- Patient is currently maintained on the following medication for hyperlipidemia: simvastatin Last lipid panel as follows:   Lab Results  Component Value Date   CHOL 167 07/04/2012   HDL 69 07/04/2012   LDLCALC 79 07/04/2012   LDLDIRECT 120.8 01/05/2008   TRIG 96 07/04/2012   CHOLHDL 2.4 07/04/2012  Patient denies myalgia. Patient reports good compliance with low fat/low cholesterol diet.   HTN-  Patient is currently maintained on the following medications for blood pressure: benicar, amlodipine Patient reports good compliance with blood pressure medications. Patient denies chest pain, shortness of breath or swelling. Last 3 blood pressure readings in our office are as follows:   BP Readings from Last 3 Encounters:  02/25/14 140/88  10/08/13 126/84  08/27/13 130/80    Review of Systems    see HPI  Past Medical History  Diagnosis Date  . Allergic rhinitis   . Hyperlipidemia   . Borderline diabetes     Type 2  . Hypertension   . ED (erectile dysfunction)   . Diabetes mellitus without complication     History   Social History  . Marital Status: Single    Spouse Name: N/A    Number of Children: N/A  . Years of Education: N/A   Occupational History  . Not on file.   Social History Main Topics  . Smoking status: Current Some Day Smoker  . Smokeless tobacco: Never Used     Comment: Pt somkes occasionally  . Alcohol Use: Yes  . Drug Use: No  . Sexual Activity: Not on file   Other Topics Concern    . 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 Appendectomy      Family History  Problem Relation Age of Onset  . Hypertension Mother   . Prostate cancer Father     deceased age 20 secondary to prostate cancer  . Diabetes type II Sister     Allergies  Allergen Reactions  . Ace Inhibitors     Current Outpatient Prescriptions on File Prior to Visit  Medication Sig Dispense Refill  . amLODipine (NORVASC) 10 MG tablet TAKE 1 TABLET BY MOUTH EVERY DAY 90 tablet 1  . aspirin EC 81 MG tablet Take 81 mg by mouth daily.    Marland Kitchen escitalopram (LEXAPRO) 10 MG tablet Take 1 tablet (10 mg total) by mouth daily. 90 tablet 1  . metFORMIN (GLUCOPHAGE-XR) 500 MG 24 hr tablet Take 1 tablet (500 mg total) by mouth daily. 90 tablet 1  . olmesartan (BENICAR) 40 MG tablet TAKE 1 TABLET BY MOUTH EVERY DAY 90 tablet 1  . ondansetron (ZOFRAN ODT) 4 MG disintegrating tablet Take 1 tablet (4 mg total) by mouth every 8 (eight) hours as needed for nausea or vomiting. [place]  tablet 0  . pantoprazole (PROTONIX) 40 MG tablet Take 1 tablet (40 mg total) by mouth daily. 90 tablet 1  . simvastatin (ZOCOR) 20 MG tablet Take 1 tablet (20 mg total) by mouth at bedtime. 90 tablet 1  . tadalafil (CIALIS) 5 MG tablet Take 1 tablet (5 mg total) by mouth daily as needed. 10 tablet 3   No current facility-administered medications on file prior to visit.    BP 140/88 mmHg  Pulse 87  Temp(Src) 98 F (36.7 C) (Oral)  Resp 16  Ht 5\' 9"  (1.753 m)  Wt 216 lb 12.8 oz (98.34 kg)  BMI 32.00 kg/m2  SpO2 99%    Objective:   Physical Exam  Constitutional: He is oriented to person, place, and time. He appears well-developed and well-nourished. No distress.  HENT:  Head: Normocephalic and atraumatic.  Cardiovascular: Normal rate and regular rhythm.   No murmur heard. Pulmonary/Chest: Effort normal and breath  sounds normal. No respiratory distress. He has no wheezes. He has no rales. He exhibits no tenderness.  Musculoskeletal: He exhibits no edema.  Neurological: He is alert and oriented to person, place, and time.  Psychiatric: He has a normal mood and affect. His behavior is normal. Judgment and thought content normal.          Assessment & Plan:  Patient's LFT's elevated last visit. Will repeat today.

## 2014-02-26 LAB — HEPATIC FUNCTION PANEL
ALBUMIN: 4.1 g/dL (ref 3.5–5.2)
ALT: 53 U/L (ref 0–53)
AST: 70 U/L — ABNORMAL HIGH (ref 0–37)
Alkaline Phosphatase: 94 U/L (ref 39–117)
Bilirubin, Direct: 0 mg/dL (ref 0.0–0.3)
TOTAL PROTEIN: 7 g/dL (ref 6.0–8.3)
Total Bilirubin: 0.4 mg/dL (ref 0.2–1.2)

## 2014-02-26 LAB — BASIC METABOLIC PANEL
BUN: 16 mg/dL (ref 6–23)
CHLORIDE: 102 meq/L (ref 96–112)
CO2: 20 mEq/L (ref 19–32)
Calcium: 8.4 mg/dL (ref 8.4–10.5)
Creatinine, Ser: 0.7 mg/dL (ref 0.4–1.5)
GFR: 144.75 mL/min (ref >60.00)
Glucose, Bld: 91 mg/dL (ref 70–99)
POTASSIUM: 4 meq/L (ref 3.5–5.1)
SODIUM: 134 meq/L — AB (ref 135–145)

## 2014-02-27 ENCOUNTER — Telehealth: Payer: Self-pay | Admitting: Family

## 2014-02-27 DIAGNOSIS — R7989 Other specified abnormal findings of blood chemistry: Secondary | ICD-10-CM

## 2014-02-27 NOTE — Telephone Encounter (Signed)
Please let pt know that I would like him to meet with hematology due to high iron levels in his blood.

## 2014-03-12 NOTE — Telephone Encounter (Signed)
Left detailed message on pt's cell# re: below recommendation and to call if any questions.

## 2014-04-03 ENCOUNTER — Telehealth: Payer: Self-pay | Admitting: Hematology & Oncology

## 2014-04-03 NOTE — Telephone Encounter (Signed)
Pt called to schedule appointment said he would call back needs to talk to boss about other days and times

## 2014-04-18 ENCOUNTER — Telehealth: Payer: Self-pay | Admitting: Hematology & Oncology

## 2014-04-18 NOTE — Telephone Encounter (Signed)
Left vm w NEW PATIENT today to remind them of their appointment with Dr. Ennever. Also, advised them to bring all medication bottles and insurance card information. ° °

## 2014-04-19 ENCOUNTER — Ambulatory Visit: Payer: Managed Care, Other (non HMO)

## 2014-04-19 ENCOUNTER — Other Ambulatory Visit (HOSPITAL_BASED_OUTPATIENT_CLINIC_OR_DEPARTMENT_OTHER): Payer: Managed Care, Other (non HMO) | Admitting: Lab

## 2014-04-19 ENCOUNTER — Ambulatory Visit (HOSPITAL_BASED_OUTPATIENT_CLINIC_OR_DEPARTMENT_OTHER): Payer: Managed Care, Other (non HMO) | Admitting: Family

## 2014-04-19 VITALS — BP 137/89 | HR 81 | Temp 98.2°F | Wt 215.0 lb

## 2014-04-19 DIAGNOSIS — R740 Nonspecific elevation of levels of transaminase and lactic acid dehydrogenase [LDH]: Secondary | ICD-10-CM

## 2014-04-19 DIAGNOSIS — Q120 Congenital cataract: Principal | ICD-10-CM

## 2014-04-19 DIAGNOSIS — R799 Abnormal finding of blood chemistry, unspecified: Secondary | ICD-10-CM

## 2014-04-19 LAB — IRON AND TIBC CHCC
%SAT: 24 % (ref 20–55)
Iron: 76 ug/dL (ref 42–163)
TIBC: 320 ug/dL (ref 202–409)
UIBC: 244 ug/dL (ref 117–376)

## 2014-04-19 LAB — CBC WITH DIFFERENTIAL (CANCER CENTER ONLY)
BASO#: 0 10*3/uL (ref 0.0–0.2)
BASO%: 0.5 % (ref 0.0–2.0)
EOS ABS: 0.1 10*3/uL (ref 0.0–0.5)
EOS%: 2.9 % (ref 0.0–7.0)
HEMATOCRIT: 38.3 % — AB (ref 38.7–49.9)
HGB: 12.9 g/dL — ABNORMAL LOW (ref 13.0–17.1)
LYMPH#: 1.6 10*3/uL (ref 0.9–3.3)
LYMPH%: 40.9 % (ref 14.0–48.0)
MCH: 34.1 pg — ABNORMAL HIGH (ref 28.0–33.4)
MCHC: 33.7 g/dL (ref 32.0–35.9)
MCV: 101 fL — AB (ref 82–98)
MONO#: 0.5 10*3/uL (ref 0.1–0.9)
MONO%: 12.1 % (ref 0.0–13.0)
NEUT#: 1.7 10*3/uL (ref 1.5–6.5)
NEUT%: 43.6 % (ref 40.0–80.0)
Platelets: 266 10*3/uL (ref 145–400)
RBC: 3.78 10*6/uL — AB (ref 4.20–5.70)
RDW: 12.3 % (ref 11.1–15.7)
WBC: 3.8 10*3/uL — AB (ref 4.0–10.0)

## 2014-04-19 LAB — CMP (CANCER CENTER ONLY)
ALT: 30 U/L (ref 10–47)
AST: 36 U/L (ref 11–38)
Albumin: 3.7 g/dL (ref 3.3–5.5)
Alkaline Phosphatase: 79 U/L (ref 26–84)
BUN: 11 mg/dL (ref 7–22)
CO2: 27 mEq/L (ref 18–33)
CREATININE: 0.8 mg/dL (ref 0.6–1.2)
Calcium: 9.5 mg/dL (ref 8.0–10.3)
Chloride: 101 mEq/L (ref 98–108)
Glucose, Bld: 105 mg/dL (ref 73–118)
Potassium: 4.1 mEq/L (ref 3.3–4.7)
Sodium: 140 mEq/L (ref 128–145)
Total Bilirubin: 0.8 mg/dl (ref 0.20–1.60)
Total Protein: 7.4 g/dL (ref 6.4–8.1)

## 2014-04-19 LAB — FERRITIN CHCC: FERRITIN: 579 ng/mL — AB (ref 22–316)

## 2014-04-19 NOTE — Progress Notes (Signed)
Hematology/Oncology Consultation   Name: Drew Harris      MRN: [id number]    Location: Room/bed info not found  Date: 04/19/2014 Time:11:28 AM   REFERRING PHYSICIAN: Debbrah Alar   REASON FOR CONSULT: Elevated ferritin and LFTs    DIAGNOSIS:   1. Hepatic steatosis   HISTORY OF PRESENT ILLNESS: Drew Harris is a very pleasant 59 yo [place] American male who recently had an elevated ferritin level (908) and AST (70). He is doing well and has no complaints at this time.  His hemachromatosis DNA testing was negative.  CT scan on June of last year showed that he has a fatty liver.  He is on Zocor for high cholesterol. This is not a new medication, he has been taking for several years.  He is an occasional smoker. He denies fever, chills, n/v, cough, rash, headache, dizziness, SOB, chest pain, palpitations, abdominal pain, constipation, diarrhea, blood in urine or stool. No problems with infections.  No swelling, tenderness, numbness or tingling in her extremities. No new aches or pains.  His appetite is good and he stays hydrated. His weight is stable.  He has no personal or familial history of anemia, sickle cell, bleeding or clotting disorders.  His father had prostate cancer, 2 sisters with breast cancer and one sister with kidney cancer.  His last PSA was in December 2013 and was normal at 0.27.  He works in [employer].   ROS: All other 10 point review of systems is negative.   PAST MEDICAL HISTORY:   Past Medical History  Diagnosis Date  . Allergic rhinitis   . Hyperlipidemia   . Borderline diabetes     Type 2  . Hypertension   . ED (erectile dysfunction)   . Diabetes mellitus without complication     ALLERGIES: Allergies  Allergen Reactions  . Ace Inhibitors       MEDICATIONS:  Current Outpatient Prescriptions on File Prior to Visit  Medication Sig Dispense Refill  . amLODipine (NORVASC) 10 MG tablet TAKE 1 TABLET BY MOUTH EVERY DAY 90  tablet 1  . aspirin EC 81 MG tablet Take 81 mg by mouth daily.    Marland Kitchen escitalopram (LEXAPRO) 10 MG tablet Take 1 tablet (10 mg total) by mouth daily. 90 tablet 1  . metFORMIN (GLUCOPHAGE-XR) 500 MG 24 hr tablet Take 1 tablet (500 mg total) by mouth daily. 90 tablet 1  . olmesartan (BENICAR) 40 MG tablet TAKE 1 TABLET BY MOUTH EVERY DAY 90 tablet 1  . ondansetron (ZOFRAN ODT) 4 MG disintegrating tablet Take 1 tablet (4 mg total) by mouth every 8 (eight) hours as needed for nausea or vomiting. 20 tablet 0  . pantoprazole (PROTONIX) 40 MG tablet Take 1 tablet (40 mg total) by mouth daily. 90 tablet 1  . simvastatin (ZOCOR) 20 MG tablet Take 1 tablet (20 mg total) by mouth at bedtime. 90 tablet 1  . tadalafil (CIALIS) 5 MG tablet Take 1 tablet (5 mg total) by mouth daily as needed. 10 tablet 3   No current facility-administered medications on file prior to visit.     PAST SURGICAL HISTORY Past Surgical History  Procedure Laterality Date  . Appendectomy      FAMILY HISTORY: Family History  Problem Relation Age of Onset  . Hypertension Mother   . Prostate cancer Father     deceased age 28 secondary to prostate cancer  . Diabetes type II Sister     SOCIAL HISTORY:  reports that  he has been smoking.  He has never used smokeless tobacco. He reports that he drinks alcohol. He reports that he does not use illicit drugs.  PERFORMANCE STATUS: The patient's performance status is 0 - Asymptomatic  PHYSICAL EXAM: Most Recent Vital Signs: Blood pressure 137/89, pulse 81, temperature 98.2 F (36.8 C), temperature source Oral, weight 215 lb (97.523 kg). BP 137/89 mmHg  Pulse 81  Temp(Src) 98.2 F (36.8 C) (Oral)  Wt 215 lb (97.523 kg)  General Appearance:    Alert, cooperative, no distress, appears stated age  Head:    Normocephalic, without obvious abnormality, atraumatic  Eyes:    PERRL, conjunctiva/corneas clear, EOM's intact, fundi    benign, both eyes             Throat:   Lips,  mucosa, and tongue normal; teeth and gums normal  Neck:   Supple, symmetrical, trachea midline, no adenopathy;       thyroid:  No enlargement/tenderness/nodules; no carotid   bruit or JVD  Back:     Symmetric, no curvature, ROM normal, no CVA tenderness  Lungs:     Clear to auscultation bilaterally, respirations unlabored  Chest wall:    No tenderness or deformity  Heart:    Regular rate and rhythm, S1 and S2 normal, no murmur, rub   or gallop  Abdomen:     Soft, non-tender, bowel sounds active all four quadrants,    no masses, no organomegaly        Extremities:   Extremities normal, atraumatic, no cyanosis or edema  Pulses:   2+ and symmetric all extremities  Skin:   Skin color, texture, turgor normal, no rashes or lesions  Lymph nodes:   Cervical, supraclavicular, and axillary nodes normal  Neurologic:   CNII-XII intact. Normal strength, sensation and reflexes      throughout   LABORATORY DATA:  Results for orders placed or performed in visit on 04/19/14 (from the past 48 hour(s))  CBC with Differential [facility])     Status: Abnormal   Collection Time: 04/19/14  9:37 AM  Result Value Ref Range   WBC 3.8 (L) 4.0 - 10.0 10e3/uL   RBC 3.78 (L) 4.20 - 5.70 10e6/uL   HGB 12.9 (L) 13.0 - 17.1 g/dL   HCT 38.3 (L) 38.7 - 49.9 %   MCV 101 (H) 82 - 98 fL   MCH 34.1 (H) 28.0 - 33.4 pg   MCHC 33.7 32.0 - 35.9 g/dL   RDW 12.3 11.1 - 15.7 %   Platelets 266 145 - 400 10e3/uL   NEUT# 1.7 1.5 - 6.5 10e3/uL   LYMPH# 1.6 0.9 - 3.3 10e3/uL   MONO# 0.5 0.1 - 0.9 10e3/uL   Eosinophils Absolute 0.1 0.0 - 0.5 10e3/uL   BASO# 0.0 0.0 - 0.2 10e3/uL   NEUT% 43.6 40.0 - 80.0 %   LYMPH% 40.9 14.0 - 48.0 %   MONO% 12.1 0.0 - 13.0 %   EOS% 2.9 0.0 - 7.0 %   BASO% 0.5 0.0 - 2.0 %  COMPREHENSIVE METABOLIC PANEL ([facility] REFLEX ONLY)     Status: None   Collection Time: 04/19/14  9:37 AM  Result Value Ref Range   Sodium 140 128 - 145 mEq/L   Potassium 4.1 3.3 - 4.7 mEq/L   Chloride 101 98 - 108  mEq/L   CO2 27 18 - 33 mEq/L   Glucose, Bld 105 73 - 118 mg/dL   BUN, Bld 11 7 - 22 mg/dL  Creat 0.8 0.6 - 1.2 mg/dl   Total Bilirubin 0.80 0.20 - 1.60 mg/dl   Alkaline Phosphatase 79 26 - 84 U/L   AST 36 11 - 38 U/L   ALT(SGPT) 30 10 - 47 U/L   Total Protein 7.4 6.4 - 8.1 g/dL   Albumin 3.7 3.3 - 5.5 g/dL   Calcium 9.5 8.0 - 10.3 mg/dL      RADIOGRAPHY: No results found.     PATHOLOGY: None  ASSESSMENT/PLAN: Mr. Ganim is a very pleasant 59 yo [place] American male who recently had an elevated ferritin level (908) and AST (70). He is doing well and has no complaints at this time. He has no hepatomegaly.  His LFT's today are normal. We will see what his iron studies show.  It is possible that his elevated ferritin is due to his fatty liver. He is also going to speak with his PCP regarding a possible alternative to his Zocor that is more liver friendly. I do not think at this time we need to see him back in the office.  He knows to call here with any questions or concerns. We can certainly see him for any hematologic issues he has in the future.  The patient was discussed with Dr. Marin Olp and he is in agreement with the aforementioned.   [facility]

## 2014-04-24 LAB — HEMOCHROMATOSIS DNA-PCR(C282Y,H63D)

## 2014-06-10 ENCOUNTER — Encounter: Payer: Self-pay | Admitting: Family

## 2014-06-10 ENCOUNTER — Ambulatory Visit (INDEPENDENT_AMBULATORY_CARE_PROVIDER_SITE_OTHER): Payer: Managed Care, Other (non HMO) | Admitting: Family

## 2014-06-10 VITALS — BP 130/78 | HR 93 | Temp 98.3°F | Resp 16 | Ht 69.0 in | Wt 209.8 lb

## 2014-06-10 DIAGNOSIS — E119 Type 2 diabetes mellitus without complications: Secondary | ICD-10-CM | POA: Diagnosis not present

## 2014-06-10 DIAGNOSIS — R7989 Other specified abnormal findings of blood chemistry: Secondary | ICD-10-CM

## 2014-06-10 DIAGNOSIS — I10 Essential (primary) hypertension: Secondary | ICD-10-CM

## 2014-06-10 DIAGNOSIS — E785 Hyperlipidemia, unspecified: Secondary | ICD-10-CM | POA: Diagnosis not present

## 2014-06-10 DIAGNOSIS — F411 Generalized anxiety disorder: Secondary | ICD-10-CM

## 2014-06-10 NOTE — Progress Notes (Signed)
Subjective:    Patient ID: Drew Harris, male    DOB: [birth year], 57 y.o.   MRN: [id number]  HPI  Patient presents today for follow up of multiple medical problems.  Wt Readings from Last 3 Encounters:  06/10/14 209 lb 12.8 oz (95.165 kg)  04/19/14 215 lb (97.523 kg)  02/25/14 216 lb 12.8 oz (98.34 kg)    Diabetes Type 2  Pt is currently maintained on the following medications for diabetes:metformin  Lab Results  Component Value Date   HGBA1C 5.8* 01/30/2013   HGBA1C 5.5 10/18/2012   HGBA1C 5.4 07/04/2012    Lab Results  Component Value Date   MICROALBUR 11.26* 01/30/2013   LDLCALC 79 07/04/2012   CREATININE 0.8 04/19/2014    Last diabetic eye exam was : march 2015 occasional polyuria, denies polydipsia. Denies hypoglycemia Home glucose readings range: not checking  Hyperlipidemia  Patient is currently maintained on the following medication for hyperlipidemia: simvastatin Last lipid panel as follows:  Lab Results  Component Value Date   CHOL 167 07/04/2012   HDL 69 07/04/2012   LDLCALC 79 07/04/2012   LDLDIRECT 120.8 01/05/2008   TRIG 96 07/04/2012   CHOLHDL 2.4 07/04/2012   Patient denies myalgia. Patient reports good compliance with low fat/low cholesterol diet.   Hypertension  Patient is currently maintained on the following medications for blood pressure: norvasc, benicar Patient reports good compliance with blood pressure medications. Patient denies chest pain, shortness of breath or swelling. Last 3 blood pressure readings in our office are as follows: BP Readings from Last 3 Encounters:  06/10/14 130/78  04/19/14 137/89  02/25/14 140/88   Anxiety- only taking lexapro as needed.  Last dose was 3 weeks ago.  Denies current anxiety symptoms.         Review of Systems    see HPI  Past Medical History  Diagnosis Date  . Allergic rhinitis   . Hyperlipidemia   . Borderline diabetes     Type 2  . Hypertension   . ED (erectile  dysfunction)   . Diabetes mellitus without complication     History   Social History  . Marital Status: Single    Spouse Name: N/A  . Number of Children: N/A  . Years of Education: N/A   Occupational History  . Not on file.   Social History Main Topics  . Smoking status: Current Some Day Smoker  . Smokeless tobacco: Never Used     Comment: Pt somkes occasionally  . Alcohol Use: Yes  . Drug Use: No  . Sexual Activity: Not on file   Other Topics Concern  . 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 Appendectomy      Family History  Problem Relation Age of Onset  . Hypertension Mother   . Prostate cancer Father     deceased age 56 secondary to prostate cancer  . Diabetes type II Sister     Allergies  Allergen Reactions  . Ace Inhibitors     ?cough    Current Outpatient Prescriptions on File Prior to Visit  Medication Sig Dispense Refill  . amLODipine (NORVASC) 10 MG tablet TAKE 1 TABLET BY MOUTH EVERY DAY 90 tablet 1  . aspirin EC 81 MG tablet Take 81 mg by mouth daily.    . metFORMIN (GLUCOPHAGE-XR) 500 MG 24  hr tablet Take 1 tablet (500 mg total) by mouth daily. 90 tablet 1  . olmesartan (BENICAR) 40 MG tablet TAKE 1 TABLET BY MOUTH EVERY DAY 90 tablet 1  . ondansetron (ZOFRAN ODT) 4 MG disintegrating tablet Take 1 tablet (4 mg total) by mouth every 8 (eight) hours as needed for nausea or vomiting. 20 tablet 0  . pantoprazole (PROTONIX) 40 MG tablet Take 1 tablet (40 mg total) by mouth daily. 90 tablet 1  . simvastatin (ZOCOR) 20 MG tablet Take 1 tablet (20 mg total) by mouth at bedtime. 90 tablet 1  . tadalafil (CIALIS) 5 MG tablet Take 1 tablet (5 mg total) by mouth daily as needed. 10 tablet 3   No current facility-administered medications on file prior to visit.    BP 130/78 mmHg  Pulse 93  Temp(Src) 98.3 F (36.8 C) (Oral)  Resp 16   Ht 5\' 9"  (1.753 m)  Wt 209 lb 12.8 oz (95.165 kg)  BMI 30.97 kg/m2  SpO2 97%    Objective:   Physical Exam  Constitutional: He is oriented to person, place, and time. He appears well-developed and well-nourished. No distress.  HENT:  Head: Normocephalic and atraumatic.  Cardiovascular: Normal rate and regular rhythm.   No murmur heard. Pulmonary/Chest: Effort normal and breath sounds normal. No respiratory distress. He has no wheezes. He has no rales.  Musculoskeletal: He exhibits no edema.  Neurological: He is alert and oriented to person, place, and time.  Skin: Skin is warm and dry.  Psychiatric: He has a normal mood and affect. His behavior is normal. Thought content normal.          Assessment & Plan:

## 2014-06-10 NOTE — Progress Notes (Signed)
Pre visit review using our clinic review tool, if applicable. No additional management support is needed unless otherwise documented below in the visit note. 

## 2014-06-10 NOTE — Patient Instructions (Addendum)
Please complete lab work prior to leaving.  Keep up the good work with weight loss. OK to stay off lexapro- call me if you have any recurrent anxiety or depression symptoms.  Please schedule a follow up appointment in 3 months.

## 2014-06-11 ENCOUNTER — Other Ambulatory Visit: Payer: Managed Care, Other (non HMO)

## 2014-06-11 NOTE — Assessment & Plan Note (Signed)
Stable. Advised pt OK to remain off of lexapro, but he is to call us if he develops any recurrent anxiety or depression.

## 2014-06-11 NOTE — Assessment & Plan Note (Signed)
Clinically stable, obtain a1c.  

## 2014-06-11 NOTE — Assessment & Plan Note (Signed)
BP stable, continue current meds, obtain bmet.

## 2014-06-11 NOTE — Assessment & Plan Note (Signed)
Obtain lipid panel.   Lab Results  Component Value Date   ALT 30 04/19/2014   AST 36 04/19/2014   ALKPHOS 79 04/19/2014   BILITOT 0.80 04/19/2014   LFT's normal.

## 2014-06-11 NOTE — Assessment & Plan Note (Signed)
No further work up planned by hematology. Hemachromatosis testing is negative.

## 2014-06-12 LAB — BASIC METABOLIC PANEL
BUN: 18 mg/dL (ref 6–23)
CO2: 22 mEq/L (ref 19–32)
Calcium: 8.9 mg/dL (ref 8.4–10.5)
Chloride: 107 mEq/L (ref 96–112)
Creatinine, Ser: 0.84 mg/dL (ref 0.40–1.50)
GFR: 121.04 mL/min (ref >60.00)
GLUCOSE: 94 mg/dL (ref 70–99)
POTASSIUM: 4 meq/L (ref 3.5–5.1)
Sodium: 138 mEq/L (ref 135–145)

## 2014-06-12 LAB — LIPID PANEL
CHOLESTEROL: 206 mg/dL — AB (ref 0–200)
HDL: 60.8 mg/dL (ref >39.00)
Total CHOL/HDL Ratio: 3
Triglycerides: 402 mg/dL — ABNORMAL HIGH (ref 0.0–149.0)

## 2014-06-12 LAB — LDL CHOLESTEROL, DIRECT: Direct LDL: 79 mg/dL

## 2014-06-12 LAB — MICROALBUMIN / CREATININE URINE RATIO
CREATININE, U: 203.6 mg/dL
MICROALB UR: 4.8 mg/dL — AB (ref 0.0–1.9)
Microalb Creat Ratio: 2.4 mg/g (ref 0.0–30.0)

## 2014-06-12 LAB — HEMOGLOBIN A1C: HEMOGLOBIN A1C: 5.4 % (ref 4.6–6.5)

## 2014-06-13 ENCOUNTER — Telehealth: Payer: Self-pay | Admitting: Family

## 2014-06-13 NOTE — Telephone Encounter (Signed)
Sugar is fantastic.  I actually think he can stop the glucophage for now.  Continue work with diet, exercise, weight loss. Triglycerides are mildly elevated. Add fish oil 2000mg  twice daily. Please work on avoiding concentrated sweets, and limiting white carbs (rice/bread/pasta/potatoes). Instead substitute whole grain versions with reasonable portions.

## 2014-06-14 NOTE — Telephone Encounter (Signed)
Notified pt and he voices understanding. 

## 2014-09-06 LAB — HM DIABETES EYE EXAM

## 2014-09-30 ENCOUNTER — Ambulatory Visit (INDEPENDENT_AMBULATORY_CARE_PROVIDER_SITE_OTHER): Payer: Managed Care, Other (non HMO) | Admitting: Family

## 2014-09-30 ENCOUNTER — Encounter: Payer: Self-pay | Admitting: Family

## 2014-09-30 VITALS — BP 124/80 | HR 88 | Temp 98.2°F | Resp 16 | Ht 69.0 in | Wt 209.0 lb

## 2014-09-30 DIAGNOSIS — E785 Hyperlipidemia, unspecified: Secondary | ICD-10-CM | POA: Diagnosis not present

## 2014-09-30 DIAGNOSIS — E119 Type 2 diabetes mellitus without complications: Secondary | ICD-10-CM | POA: Diagnosis not present

## 2014-09-30 DIAGNOSIS — R112 Nausea with vomiting, unspecified: Secondary | ICD-10-CM | POA: Diagnosis not present

## 2014-09-30 DIAGNOSIS — I10 Essential (primary) hypertension: Secondary | ICD-10-CM

## 2014-09-30 DIAGNOSIS — F411 Generalized anxiety disorder: Secondary | ICD-10-CM

## 2014-09-30 MED ORDER — OLMESARTAN MEDOXOMIL 40 MG PO TABS: ORAL_TABLET | ORAL | Status: DC

## 2014-09-30 MED ORDER — SIMVASTATIN 20 MG PO TABS: 20.0000 mg | ORAL_TABLET | Freq: Every day | ORAL | Status: DC

## 2014-09-30 MED ORDER — PANTOPRAZOLE SODIUM 40 MG PO TBEC: 40.0000 mg | DELAYED_RELEASE_TABLET | Freq: Every day | ORAL | Status: DC

## 2014-09-30 MED ORDER — AMLODIPINE BESYLATE 10 MG PO TABS: ORAL_TABLET | ORAL | Status: DC

## 2014-09-30 MED ORDER — TADALAFIL 5 MG PO TABS: 5.0000 mg | ORAL_TABLET | Freq: Every day | ORAL | Status: DC | PRN

## 2014-09-30 NOTE — Patient Instructions (Addendum)
Please schedule a fasting lab visit at the front desk.   Follow up in 3 months for routine office visit.

## 2014-09-30 NOTE — Assessment & Plan Note (Signed)
Stable off of meds.  Monitor.  

## 2014-09-30 NOTE — Assessment & Plan Note (Signed)
Pt will return for FLP, continue simvastatin.

## 2014-09-30 NOTE — Progress Notes (Signed)
Pre visit review using our clinic review tool, if applicable. No additional management support is needed unless otherwise documented below in the visit note. 

## 2014-09-30 NOTE — Assessment & Plan Note (Signed)
Stable, obtain A1C.

## 2014-09-30 NOTE — Assessment & Plan Note (Signed)
BP stable on amlodipine, continue same.

## 2014-09-30 NOTE — Progress Notes (Signed)
Subjective:    Patient ID: Drew Harris, male    DOB: May 04, 1955, 60 y.o.   MRN: [id number]  HPI  [name] is a 59 yr old male who presents today for follow up.  Patient presents today for follow up of multiple medical problems.  Diabetes Type 2  Pt is currently maintained on the following medications for diabetes: diet  Lab Results  Component Value Date   HGBA1C 5.4 06/11/2014   HGBA1C 5.8* 01/30/2013   HGBA1C 5.5 10/18/2012    Lab Results  Component Value Date   MICROALBUR 4.8* 06/11/2014   LDLCALC 79 07/04/2012   CREATININE 0.84 06/11/2014    Last diabetic eye exam was 09/06/14- no DR per pt Denies polyuria/polydipsia. Home glucose readings range not checking  Hyperlipidemia  Patient is currently maintained on the following medication for hyperlipidemia: simvastatin Last lipid panel as follows:  Lab Results  Component Value Date   CHOL 206* 06/11/2014   HDL 60.80 06/11/2014   LDLCALC 79 07/04/2012   LDLDIRECT 79.0 06/11/2014   TRIG * 06/11/2014    402.0 Triglyceride is over 400; calculations on Lipids are invalid.   CHOLHDL 3 06/11/2014   Patient denies myalgia. Patient reports fair compliance with low fat/low cholesterol diet.   Hypertension  Patient is currently maintained on the following medications for blood pressure: amlodipine 10mg .  Patient reports good compliance with blood pressure medications. Patient denies chest pain, shortness of breath or swelling. Last 3 blood pressure readings in our office are as follows: BP Readings from Last 3 Encounters:  09/30/14 124/80  06/10/14 130/78  04/19/14 137/89   Anxiety- reports feeling well off of meds.   Review of Systems    see HPI  Past Medical History  Diagnosis Date  . Allergic rhinitis   . Hyperlipidemia   . Borderline diabetes     Type 2  . Hypertension   . ED (erectile dysfunction)   . Diabetes mellitus without complication     History   Social History  . Marital Status:  Single    Spouse Name: N/A  . Number of Children: N/A  . Years of Education: N/A   Occupational History  . Not on file.   Social History Main Topics  . Smoking status: Current Some Day Smoker  . Smokeless tobacco: Never Used     Comment: Pt somkes occasionally  . Alcohol Use: Yes  . Drug Use: No  . Sexual Activity: Not on file   Other Topics Concern  . 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 Appendectomy      Family History  Problem Relation Age of Onset  . Hypertension Mother   . Prostate cancer Father     deceased age 84 secondary to prostate cancer  . Diabetes type II Sister     Allergies  Allergen Reactions  . Ace Inhibitors     ?cough    Current Outpatient Prescriptions on File Prior to Visit  Medication Sig Dispense Refill  . amLODipine (NORVASC) 10 MG tablet TAKE 1 TABLET BY MOUTH EVERY DAY 90 tablet 1  . aspirin EC 81 MG tablet Take 81 mg by mouth daily.    Marland Kitchen olmesartan (BENICAR) 40 MG tablet TAKE 1 TABLET BY MOUTH EVERY DAY 90 tablet 1  . Omega-3 Fatty Acids (FISH OIL) 1000 MG CAPS  Take 2,000 mg by mouth 2 (two) times daily.    . pantoprazole (PROTONIX) 40 MG tablet Take 1 tablet (40 mg total) by mouth daily. 90 tablet 1  . simvastatin (ZOCOR) 20 MG tablet Take 1 tablet (20 mg total) by mouth at bedtime. 90 tablet 1  . tadalafil (CIALIS) 5 MG tablet Take 1 tablet (5 mg total) by mouth daily as needed. 10 tablet 3   No current facility-administered medications on file prior to visit.    BP 124/80 mmHg  Pulse 88  Temp(Src) 98.2 F (36.8 C) (Oral)  Resp 16  Ht 5\' 9"  (1.753 m)  Wt 209 lb (94.802 kg)  BMI 30.85 kg/m2  SpO2 99%    Objective:   Physical Exam  Constitutional: He is oriented to person, place, and time. He appears well-developed and well-nourished. No distress.  HENT:  Head: Normocephalic and atraumatic.    Cardiovascular: Normal rate and regular rhythm.   No murmur heard. Pulmonary/Chest: Effort normal and breath sounds normal. No respiratory distress. He has no wheezes. He has no rales.  Musculoskeletal: He exhibits no edema.  Neurological: He is alert and oriented to person, place, and time.  Skin: Skin is warm and dry.  Psychiatric: He has a normal mood and affect. His behavior is normal. Thought content normal.          Assessment & Plan:

## 2014-12-06 ENCOUNTER — Other Ambulatory Visit (INDEPENDENT_AMBULATORY_CARE_PROVIDER_SITE_OTHER): Payer: Managed Care, Other (non HMO)

## 2014-12-06 DIAGNOSIS — E119 Type 2 diabetes mellitus without complications: Secondary | ICD-10-CM | POA: Diagnosis not present

## 2014-12-06 DIAGNOSIS — E785 Hyperlipidemia, unspecified: Secondary | ICD-10-CM | POA: Diagnosis not present

## 2014-12-06 LAB — LIPID PANEL
CHOLESTEROL: 232 mg/dL — AB (ref 0–200)
HDL: 51.2 mg/dL (ref >39.00)
NonHDL: 180.37
TRIGLYCERIDES: 250 mg/dL — AB (ref 0.0–149.0)
Total CHOL/HDL Ratio: 5
VLDL: 50 mg/dL — ABNORMAL HIGH (ref 0.0–40.0)

## 2014-12-06 LAB — BASIC METABOLIC PANEL
BUN: 11 mg/dL (ref 6–23)
CALCIUM: 8.8 mg/dL (ref 8.4–10.5)
CO2: 25 mEq/L (ref 19–32)
Chloride: 103 mEq/L (ref 96–112)
Creatinine, Ser: 0.65 mg/dL (ref 0.40–1.50)
GFR: 162.44 mL/min (ref >60.00)
GLUCOSE: 109 mg/dL — AB (ref 70–99)
POTASSIUM: 3.5 meq/L (ref 3.5–5.1)
SODIUM: 137 meq/L (ref 135–145)

## 2014-12-06 LAB — LDL CHOLESTEROL, DIRECT: Direct LDL: 124 mg/dL

## 2014-12-06 LAB — HEMOGLOBIN A1C: Hgb A1c MFr Bld: 5.4 % (ref 4.6–6.5)

## 2014-12-08 ENCOUNTER — Telehealth: Payer: Self-pay | Admitting: Family

## 2014-12-08 MED ORDER — ATORVASTATIN CALCIUM 20 MG PO TABS: 20.0000 mg | ORAL_TABLET | Freq: Every day | ORAL | Status: DC

## 2014-12-08 NOTE — Telephone Encounter (Signed)
Cholesterol remains above goal, d/c simvastatin, start atorvastatin. We will plan to repeat his lab work at his follow up appointment next month.

## 2014-12-09 NOTE — Telephone Encounter (Signed)
Left message for pt to return my call.

## 2014-12-10 NOTE — Telephone Encounter (Signed)
Notified pt and he voices understanding. Agreeable to medication change. Advised pt we will not have anyone in the lab on his lab appt in November as he is scheduled at [place] him we could schedule a lab visit at 7am a couple days before f/u and pt states he will have to check his work schedule and see what he can work out.

## 2014-12-30 ENCOUNTER — Telehealth: Payer: Self-pay | Admitting: *Deleted

## 2014-12-30 MED ORDER — ATORVASTATIN CALCIUM 20 MG PO TABS: 20.0000 mg | ORAL_TABLET | Freq: Every day | ORAL | Status: DC

## 2014-12-30 NOTE — Telephone Encounter (Signed)
Received fax from [facility] requesting 90 day supply of atorvastatin. Rx sent.

## 2015-02-03 ENCOUNTER — Encounter: Payer: Self-pay | Admitting: Family

## 2015-02-03 ENCOUNTER — Ambulatory Visit (INDEPENDENT_AMBULATORY_CARE_PROVIDER_SITE_OTHER): Payer: Managed Care, Other (non HMO) | Admitting: Family

## 2015-02-03 VITALS — BP 122/65 | HR 108 | Temp 98.4°F | Resp 18 | Ht 69.0 in | Wt 204.4 lb

## 2015-02-03 DIAGNOSIS — E119 Type 2 diabetes mellitus without complications: Secondary | ICD-10-CM | POA: Diagnosis not present

## 2015-02-03 DIAGNOSIS — K219 Gastro-esophageal reflux disease without esophagitis: Secondary | ICD-10-CM

## 2015-02-03 DIAGNOSIS — F411 Generalized anxiety disorder: Secondary | ICD-10-CM | POA: Diagnosis not present

## 2015-02-03 DIAGNOSIS — E1310 Other specified diabetes mellitus with ketoacidosis without coma: Secondary | ICD-10-CM

## 2015-02-03 DIAGNOSIS — Z23 Encounter for immunization: Secondary | ICD-10-CM

## 2015-02-03 DIAGNOSIS — E1110 Type 2 diabetes mellitus with ketoacidosis without coma: Secondary | ICD-10-CM

## 2015-02-03 DIAGNOSIS — E785 Hyperlipidemia, unspecified: Secondary | ICD-10-CM

## 2015-02-03 NOTE — Progress Notes (Signed)
Pre visit review using our clinic review tool, if applicable. No additional management support is needed unless otherwise documented below in the visit note. 

## 2015-02-03 NOTE — Progress Notes (Signed)
Subjective:    Patient ID: Drew Harris, male    DOB: 06/08/1955, 59 y.o.   MRN: [id number]  HPI   [name] is a 59 yr old male who presents today for follow up.   Patient presents today for follow up of multiple medical problems.  Diabetes Type 2  Pt is currently maintained on the following medications for diabetes:diabetic diet alone  Lab Results  Component Value Date   HGBA1C 5.4 12/06/2014   HGBA1C 5.4 06/11/2014   HGBA1C 5.8* 01/30/2013    Lab Results  Component Value Date   MICROALBUR 4.8* 06/11/2014   LDLCALC 79 07/04/2012   CREATININE 0.65 12/06/2014    Last diabetic eye exam was  Lab Results  Component Value Date   HMDIABEYEEXA No Retinopathy 09/06/2014   Denies polyuria/polydipsia. Denies hypoglycemia  Hyperlipidemia  Patient is currently maintained on the following medication for hyperlipidemia: lipitor 20mg .  Last lipid panel as follows:  Lab Results  Component Value Date   CHOL 232* 12/06/2014   HDL 51.20 12/06/2014   LDLCALC 79 07/04/2012   LDLDIRECT 124.0 12/06/2014   TRIG 250.0* 12/06/2014   CHOLHDL 5 12/06/2014   Patient denies myalgia. Patient reports good compliance with low fat/low cholesterol diet.   Hypertension  Patient is currently maintained on the following medications for blood pressure: benicar, amlodipine Patient reports good compliance with blood pressure medications. Patient denies chest pain, shortness of breath or swelling. Last 3 blood pressure readings in our office are as follows: BP Readings from Last 3 Encounters:  02/03/15 122/65  09/30/14 124/80  06/10/14 130/78   Anxiety- not currently on meds. Reports overall well controlled.  GERD- maintained on protonix.          Review of Systems See HPI  Past Medical History  Diagnosis Date  . Allergic rhinitis   . Hyperlipidemia   . Borderline diabetes     Type 2  . Hypertension   . ED (erectile dysfunction)   . Diabetes mellitus without  complication [facility])     Social History   Social History  . Marital Status: Single    Spouse Name: N/A  . Number of Children: N/A  . Years of Education: N/A   Occupational History  . Not on file.   Social History Main Topics  . Smoking status: Current Some Day Smoker  . Smokeless tobacco: Never Used     Comment: Pt somkes occasionally  . Alcohol Use: Yes  . Drug Use: No  . Sexual Activity: Not on file   Other Topics Concern  . 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 Appendectomy      Family History  Problem Relation Age of Onset  . Hypertension Mother   . Prostate cancer Father     deceased age 58 secondary to prostate cancer  . Diabetes type II Sister     Allergies  Allergen Reactions  . Ace Inhibitors     ?cough    Current Outpatient Prescriptions on File Prior to Visit  Medication Sig Dispense Refill  . amLODipine (NORVASC) 10 MG tablet TAKE 1 TABLET BY MOUTH EVERY DAY 90 tablet 1  . aspirin EC 81 MG tablet Take 81 mg by mouth daily.    Marland Kitchen atorvastatin (LIPITOR) 20 MG tablet Take 1 tablet (20 mg total) by mouth daily. [place]  tablet 0  . olmesartan (BENICAR) 40 MG tablet TAKE 1 TABLET BY MOUTH EVERY DAY 90 tablet 1  . Omega-3 Fatty Acids (FISH OIL) 1000 MG CAPS Take 2,000 mg by mouth 2 (two) times daily.    . pantoprazole (PROTONIX) 40 MG tablet Take 1 tablet (40 mg total) by mouth daily. 90 tablet 1  . tadalafil (CIALIS) 5 MG tablet Take 1 tablet (5 mg total) by mouth daily as needed. 10 tablet 5   No current facility-administered medications on file prior to visit.    BP 122/65 mmHg  Pulse 108  Temp(Src) 98.4 F (36.9 C) (Oral)  Resp 18  Ht 5\' 9"  (1.753 m)  Wt 204 lb 6.4 oz (92.715 kg)  BMI 30.17 kg/m2  SpO2 100%       Objective:   Physical Exam  Constitutional: He is oriented to person, place, and time. He appears  well-developed and well-nourished. No distress.  HENT:  Head: Normocephalic and atraumatic.  Cardiovascular: Normal rate and regular rhythm.   No murmur heard. Pulmonary/Chest: Effort normal and breath sounds normal. No respiratory distress. He has no wheezes. He has no rales.  Musculoskeletal: He exhibits no edema.  Lymphadenopathy:    He has no cervical adenopathy.  Neurological: He is alert and oriented to person, place, and time.  Skin: Skin is warm and dry.  Psychiatric: He has a normal mood and affect. His behavior is normal. Thought content normal.          Assessment & Plan:

## 2015-02-03 NOTE — Patient Instructions (Signed)
Please schedule fasting lab draw at the front desk.

## 2015-02-06 DIAGNOSIS — K219 Gastro-esophageal reflux disease without esophagitis: Secondary | ICD-10-CM | POA: Insufficient documentation

## 2015-02-06 NOTE — Assessment & Plan Note (Signed)
Stable on PPI 

## 2015-02-06 NOTE — Assessment & Plan Note (Signed)
Stable.  Monitor.  

## 2015-02-06 NOTE — Assessment & Plan Note (Signed)
Stable on diabetic diet, obtain a1c.

## 2015-02-06 NOTE — Assessment & Plan Note (Signed)
Above goal, pt will return for lipid panel, continue statin.

## 2015-04-08 ENCOUNTER — Other Ambulatory Visit: Payer: Managed Care, Other (non HMO)

## 2015-04-17 ENCOUNTER — Other Ambulatory Visit: Payer: Managed Care, Other (non HMO)

## 2015-04-17 LAB — LIPID PANEL
CHOL/HDL RATIO: 9
CHOLESTEROL: 262 mg/dL — AB (ref 0–200)
HDL: 28.6 mg/dL — ABNORMAL LOW (ref >39.00)

## 2015-04-17 LAB — BASIC METABOLIC PANEL
BUN: 18 mg/dL (ref 6–23)
CHLORIDE: 99 meq/L (ref 96–112)
CO2: 24 mEq/L (ref 19–32)
CREATININE: 0.73 mg/dL (ref 0.40–1.50)
Calcium: 9.1 mg/dL (ref 8.4–10.5)
GFR: 141.9 mL/min (ref >60.00)
Glucose, Bld: 79 mg/dL (ref 70–99)
POTASSIUM: 4.1 meq/L (ref 3.5–5.1)
Sodium: 134 mEq/L — ABNORMAL LOW (ref 135–145)

## 2015-04-17 LAB — LDL CHOLESTEROL, DIRECT: LDL DIRECT: 43 mg/dL

## 2015-04-17 LAB — HEMOGLOBIN A1C: HEMOGLOBIN A1C: 5.3 % (ref 4.6–6.5)

## 2015-04-18 ENCOUNTER — Other Ambulatory Visit: Payer: Self-pay | Admitting: Family

## 2015-04-18 DIAGNOSIS — E785 Hyperlipidemia, unspecified: Secondary | ICD-10-CM

## 2015-04-18 MED ORDER — ATORVASTATIN CALCIUM 80 MG PO TABS: 80.0000 mg | ORAL_TABLET | Freq: Every day | ORAL | Status: DC

## 2015-04-18 MED ORDER — EZETIMIBE 10 MG PO TABS: 10.0000 mg | ORAL_TABLET | Freq: Every day | ORAL | Status: DC

## 2015-04-18 NOTE — Telephone Encounter (Signed)
Trigs extremely high, increase atorvastatin from 20mg  to 80mg . Add zetia. Repeat FLP in 1 month.

## 2015-04-21 MED ORDER — FENOFIBRATE 145 MG PO TABS: 145.0000 mg | ORAL_TABLET | Freq: Every day | ORAL | Status: DC

## 2015-04-21 MED ORDER — ROSUVASTATIN CALCIUM 10 MG PO TABS: 10.0000 mg | ORAL_TABLET | Freq: Every day | ORAL | Status: DC

## 2015-04-21 NOTE — Telephone Encounter (Signed)
Cancelled Rxs per [facility] at [facility].  Notified pt of below and he is agreeable to try medications. Rxs sent. Lab appt scheduled for 06/02/15 at 7am, future order entered.

## 2015-04-21 NOTE — Telephone Encounter (Signed)
Notified pt. He states he was unable to tolerate atorvastatin and stopped medication. States he was nauseated, had dark urine and his head felt like it was on fire. Pt wants to know if you have any other recommendation?  Please advise.

## 2015-04-21 NOTE — Telephone Encounter (Signed)
Please cancel rx for zetia and atorvastatin at pharmacy.  Try crestor 10 mg once daily.  Try fenofibrate once daily.  Repeat lipids in 6 weeks. Call if side effects with these medications.

## 2015-05-05 ENCOUNTER — Encounter: Payer: Self-pay | Admitting: Family

## 2015-05-05 ENCOUNTER — Ambulatory Visit (INDEPENDENT_AMBULATORY_CARE_PROVIDER_SITE_OTHER): Payer: Managed Care, Other (non HMO) | Admitting: Family

## 2015-05-05 VITALS — BP 130/80 | HR 84 | Temp 98.6°F | Resp 16 | Ht 69.0 in | Wt 205.0 lb

## 2015-05-05 DIAGNOSIS — E119 Type 2 diabetes mellitus without complications: Secondary | ICD-10-CM

## 2015-05-05 DIAGNOSIS — E785 Hyperlipidemia, unspecified: Secondary | ICD-10-CM

## 2015-05-05 DIAGNOSIS — F411 Generalized anxiety disorder: Secondary | ICD-10-CM

## 2015-05-05 DIAGNOSIS — M545 Low back pain, unspecified: Secondary | ICD-10-CM

## 2015-05-05 DIAGNOSIS — I10 Essential (primary) hypertension: Secondary | ICD-10-CM

## 2015-05-05 LAB — POCT URINALYSIS DIPSTICK
Bilirubin, UA: NEGATIVE
GLUCOSE UA: NEGATIVE
Ketones, UA: NEGATIVE
Leukocytes, UA: NEGATIVE
NITRITE UA: NEGATIVE
PH UA: 7
PROTEIN UA: NEGATIVE
RBC UA: NEGATIVE
SPEC GRAV UA: 1.02
UROBILINOGEN UA: NEGATIVE

## 2015-05-05 MED ORDER — CYCLOBENZAPRINE HCL 5 MG PO TABS: 5.0000 mg | ORAL_TABLET | Freq: Every evening | ORAL | Status: DC | PRN

## 2015-05-05 MED ORDER — MELOXICAM 7.5 MG PO TABS: 7.5000 mg | ORAL_TABLET | Freq: Every day | ORAL | Status: DC

## 2015-05-05 MED ORDER — LOSARTAN POTASSIUM 25 MG PO TABS: 25.0000 mg | ORAL_TABLET | Freq: Every day | ORAL | Status: DC

## 2015-05-05 NOTE — Patient Instructions (Addendum)
OK to remain off of benicar. Start losartan once daily (for kidney protection) Start meloxicam once daily (anti-inflammatory) for back pain- ok for 1-2 weeks. You may use flexeril (muscle relaxer) at bedtime as needed for back spasm/pain.  Call if back pain worsens or does not improve.

## 2015-05-05 NOTE — Progress Notes (Signed)
Subjective:    Patient ID: Drew Harris, male    DOB: 1956-02-04, 60 y.o.   MRN: [id number]  HPI  Drew Harris is a 60 yr old male who presents today for follow up.  1) HTN- currently on amlodipine. Reports that he is no longer taking benicar BP Readings from Last 3 Encounters:  05/05/15 130/80  02/03/15 122/65  09/30/14 124/80    2) DM2- not currently on meds.   Lab Results  Component Value Date   HGBA1C 5.3 04/17/2015   HGBA1C 5.4 12/06/2014   HGBA1C 5.4 06/11/2014   Lab Results  Component Value Date   MICROALBUR 4.8* 06/11/2014   LDLCALC 79 07/04/2012   CREATININE 0.73 04/17/2015   3) Anxiety- reports anxiety is good, and mood is good.    4) Back pain- reports LL back pain x 3 days. Radiates around into the left lower abdomen. Has tried ibuprofen with little improvement.   5) Hyperlipidemia- pt reported that previously he was unable to tolerate atorvastatin. He stopped due to dark urine and headache.  He was noted to have severe hypertriglyceridemia on 04/18/15.  Earlier this month he was given trial of crestor and fenofibrate.  Tolerating these meds without difficulty.    Lab Results  Component Value Date   CHOL 262* 04/17/2015   HDL 28.60* 04/17/2015   LDLCALC 79 07/04/2012   LDLDIRECT 43.0 04/17/2015   TRIG * 04/17/2015    1427.0 Triglyceride is over 400; calculations on Lipids are invalid.   CHOLHDL 9 04/17/2015      Review of Systems See HPI  Past Medical History  Diagnosis Date  . Allergic rhinitis   . Hyperlipidemia   . Borderline diabetes     Type 2  . Hypertension   . ED (erectile dysfunction)   . Diabetes mellitus without complication [facility])     Social History   Social History  . Marital Status: Single    Spouse Name: N/A  . Number of Children: N/A  . Years of Education: N/A   Occupational History  . Not on file.   Social History Main Topics  . Smoking status: Current Some Day Smoker  . Smokeless tobacco: Never Used   Comment: Pt somkes occasionally  . Alcohol Use: Yes  . Drug Use: No  . Sexual Activity: Not on file   Other Topics Concern  . 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 Appendectomy      Family History  Problem Relation Age of Onset  . Hypertension Mother   . Prostate cancer Father     deceased age 66 secondary to prostate cancer  . Diabetes type II Sister     Allergies  Allergen Reactions  . Ace Inhibitors     ?cough  . Atorvastatin Nausea Only    "Dark urine"    Current Outpatient Prescriptions on File Prior to Visit  Medication Sig Dispense Refill  . amLODipine (NORVASC) 10 MG tablet TAKE 1 TABLET BY MOUTH EVERY DAY 90 tablet 1  . aspirin EC 81 MG tablet Take 81 mg by mouth daily.    . fenofibrate (TRICOR) 145 MG tablet Take 1 tablet (145 mg total) by mouth daily. 30 tablet 5  . Omega-3 Fatty Acids (FISH OIL) 1000 MG CAPS Take 2,000 mg by mouth 2 (two) times daily. Reported on 05/05/2015    .  rosuvastatin (CRESTOR) 10 MG tablet Take 1 tablet (10 mg total) by mouth daily. 30 tablet 5   No current facility-administered medications on file prior to visit.    BP 130/80 mmHg  Pulse 84  Temp(Src) 98.6 F (37 C) (Oral)  Resp 16  Ht 5\' 9"  (1.753 m)  Wt 205 lb (92.987 kg)  BMI 30.26 kg/m2  SpO2 98%       Objective:   Physical Exam  Constitutional: He is oriented to person, place, and time. He appears well-developed and well-nourished. No distress.  HENT:  Head: Normocephalic and atraumatic.  Cardiovascular: Normal rate and regular rhythm.   No murmur heard. Pulmonary/Chest: Effort normal and breath sounds normal. No respiratory distress. He has no wheezes. He has no rales.  Musculoskeletal: He exhibits no edema.       Lumbar back: He exhibits tenderness.  Left lower tenderness  Neurological: He is alert and oriented to person, place, and time.  He exhibits normal muscle tone.  Skin: Skin is warm and dry.  Psychiatric: He has a normal mood and affect. His behavior is normal. Thought content normal.          Assessment & Plan:  L low back pain- trial of meloxicam and HS flexeril PRN.

## 2015-05-05 NOTE — Progress Notes (Signed)
Pre visit review using our clinic review tool, if applicable. No additional management support is needed unless otherwise documented below in the visit note. 

## 2015-05-05 NOTE — Assessment & Plan Note (Signed)
BP looks ok off of benicar 40mg , however he has microalbuminuria and is intolerant to ACE due to cough. Will initiate losartan 25mg  for BP and renal protection. Plan follow up in 1 month for nurse visit bp check and bmet.

## 2015-05-05 NOTE — Assessment & Plan Note (Signed)
Tolerating crestor and fenofibrate, plan follow up lipids in 1 month.

## 2015-05-05 NOTE — Assessment & Plan Note (Signed)
Stable off of meds.  Monitor.  

## 2015-05-05 NOTE — Assessment & Plan Note (Addendum)
Well controlled. Stable with diet.

## 2015-05-17 ENCOUNTER — Other Ambulatory Visit: Payer: Self-pay | Admitting: Family

## 2015-06-02 ENCOUNTER — Ambulatory Visit (INDEPENDENT_AMBULATORY_CARE_PROVIDER_SITE_OTHER): Payer: Managed Care, Other (non HMO) | Admitting: Family

## 2015-06-02 ENCOUNTER — Other Ambulatory Visit (INDEPENDENT_AMBULATORY_CARE_PROVIDER_SITE_OTHER): Payer: Managed Care, Other (non HMO)

## 2015-06-02 ENCOUNTER — Encounter: Payer: Self-pay | Admitting: Family

## 2015-06-02 VITALS — BP 120/82 | HR 92 | Temp 97.8°F | Resp 16

## 2015-06-02 DIAGNOSIS — I10 Essential (primary) hypertension: Secondary | ICD-10-CM

## 2015-06-02 DIAGNOSIS — E785 Hyperlipidemia, unspecified: Secondary | ICD-10-CM | POA: Diagnosis not present

## 2015-06-02 LAB — BASIC METABOLIC PANEL
BUN: 18 mg/dL (ref 6–23)
CALCIUM: 9.1 mg/dL (ref 8.4–10.5)
CO2: 25 mEq/L (ref 19–32)
CREATININE: 0.71 mg/dL (ref 0.40–1.50)
Chloride: 105 mEq/L (ref 96–112)
GFR: 146.46 mL/min (ref >60.00)
Glucose, Bld: 107 mg/dL — ABNORMAL HIGH (ref 70–99)
Potassium: 3.8 mEq/L (ref 3.5–5.1)
SODIUM: 139 meq/L (ref 135–145)

## 2015-06-02 LAB — LIPID PANEL
Cholesterol: 143 mg/dL (ref 0–200)
HDL: 56.7 mg/dL (ref >39.00)
NONHDL: 86.55
Total CHOL/HDL Ratio: 3
Triglycerides: 285 mg/dL — ABNORMAL HIGH (ref 0.0–149.0)
VLDL: 57 mg/dL — AB (ref 0.0–40.0)

## 2015-06-02 LAB — LDL CHOLESTEROL, DIRECT: Direct LDL: 40 mg/dL

## 2015-06-02 NOTE — Progress Notes (Signed)
   Subjective:    Patient ID: Drew Harris, male    DOB: 07/07/1955, 60 y.o.   MRN: [id number]  HPI    Review of Systems     Objective:   Physical Exam        Assessment & Plan:  BP reviewed and stable. Continue current meds.

## 2015-06-02 NOTE — Progress Notes (Signed)
Patient ID: Drew Harris, male   DOB: June 21, 1955, 60 y.o.   MRN: [id number]  Pt here for nurse visit for BP check. Pt currently taking amlodipine 10mg  daily and losartan 25mg  daily. He reports compliance with  Medications and reports no side effects / new symptoms.

## 2015-06-04 ENCOUNTER — Encounter: Payer: Self-pay | Admitting: Family

## 2015-06-04 ENCOUNTER — Telehealth: Payer: Self-pay | Admitting: Family

## 2015-06-04 MED ORDER — ROSUVASTATIN CALCIUM 20 MG PO TABS: 20.0000 mg | ORAL_TABLET | Freq: Every day | ORAL | Status: DC

## 2015-06-04 NOTE — Telephone Encounter (Signed)
Left message for pt to return my call.

## 2015-06-04 NOTE — Telephone Encounter (Signed)
Cholesterol and triglycerides have improved tremendously. However triglycerides still are elevated.  I would like him to increase crestor from 10mg  to 20mg  and continue work on dietary changes:   avoiding concentrated sweets, and limiting white carbs (rice/bread/pasta/potatoes). Instead substitute whole grain versions with reasonable portions.

## 2015-06-04 NOTE — Telephone Encounter (Signed)
Notified pt and he voices understanding. 

## 2015-08-17 ENCOUNTER — Other Ambulatory Visit: Payer: Self-pay | Admitting: Family

## 2015-08-20 ENCOUNTER — Telehealth: Payer: Self-pay | Admitting: *Deleted

## 2015-08-20 NOTE — Telephone Encounter (Signed)
Received call from pt stating he has had left knee and leg pain x 1 week. States he has some swelling of same leg. Denies redness, fever, long car rides or flights. No other symptoms. States that he stands on his feet all day for his job. PCP out of the office. Scheduled pt app for tomorrow at 1pm with Mackie Pai, PA.

## 2015-08-21 ENCOUNTER — Ambulatory Visit (INDEPENDENT_AMBULATORY_CARE_PROVIDER_SITE_OTHER): Payer: Managed Care, Other (non HMO) | Admitting: Medical

## 2015-08-21 ENCOUNTER — Other Ambulatory Visit: Payer: Self-pay | Admitting: Medical

## 2015-08-21 ENCOUNTER — Telehealth: Payer: Self-pay | Admitting: Medical

## 2015-08-21 ENCOUNTER — Ambulatory Visit (HOSPITAL_BASED_OUTPATIENT_CLINIC_OR_DEPARTMENT_OTHER)
Admission: RE | Admit: 2015-08-21 | Discharge: 2015-08-21 | Disposition: A | Discharge status: Home / Self Care | Payer: Managed Care, Other (non HMO) | Source: Ambulatory Visit | Attending: Medical | Admitting: Medical

## 2015-08-21 ENCOUNTER — Encounter: Payer: Self-pay | Admitting: Medical

## 2015-08-21 VITALS — BP 118/78 | HR 71 | Temp 98.1°F | Resp 16 | Ht 69.0 in | Wt 216.0 lb

## 2015-08-21 DIAGNOSIS — R600 Localized edema: Secondary | ICD-10-CM

## 2015-08-21 DIAGNOSIS — M5432 Sciatica, left side: Secondary | ICD-10-CM | POA: Diagnosis not present

## 2015-08-21 DIAGNOSIS — M25562 Pain in left knee: Secondary | ICD-10-CM | POA: Diagnosis not present

## 2015-08-21 DIAGNOSIS — M25569 Pain in unspecified knee: Secondary | ICD-10-CM

## 2015-08-21 DIAGNOSIS — M25462 Effusion, left knee: Secondary | ICD-10-CM | POA: Diagnosis not present

## 2015-08-21 MED ORDER — DICLOFENAC SODIUM 75 MG PO TBEC: 75.0000 mg | DELAYED_RELEASE_TABLET | Freq: Two times a day (BID) | ORAL | Status: DC

## 2015-08-21 NOTE — Telephone Encounter (Signed)
Referral to ortho placed.

## 2015-08-21 NOTE — Progress Notes (Signed)
Pre visit review using our clinic review tool, if applicable. No additional management support is needed unless otherwise documented below in the visit note. 

## 2015-08-21 NOTE — Progress Notes (Signed)
Subjective:    Patient ID: Drew Harris, male    DOB: 02/03/1956, 60 y.o.   MRN: [id number]  HPI  Pt in reporting he has moderate left leg pain. He states whole leg hurts. Knee may hurt more. Some pain behind knee. He points and describe some pain lower leg and thigh as well. Pt has no hip pain.   Pain present for 2 weeks.   Pt thought left foot was little swollen.   He reports some pain that radiates up leg to low back. Point to rt si area. But no pain radiating down his leg. But some pain in left si area when he bends over.  No fall or trauma.   Pt work at [employer].   Review of Systems  Constitutional: Negative for fever, chills and fatigue.  Respiratory: Negative for cough, chest tightness, shortness of breath and wheezing.   Cardiovascular: Negative for chest pain and palpitations.  Gastrointestinal: Negative for abdominal pain.  Musculoskeletal: Positive for back pain. Negative for myalgias, joint swelling, arthralgias, gait problem and neck pain.       Lt si area.  Lt knee pain.  Lt leg swelling. Distal calf and knee.   Skin: Negative for rash.  Neurological: Negative for dizziness and light-headedness.  Hematological: Negative for adenopathy. Does not bruise/bleed easily.  Psychiatric/Behavioral: Negative for behavioral problems and confusion.    Past Medical History  Diagnosis Date  . Allergic rhinitis   . Hyperlipidemia   . Borderline diabetes     Type 2  . Hypertension   . ED (erectile dysfunction)   . Diabetes mellitus without complication [facility])      Social History   Social History  . Marital Status: Single    Spouse Name: N/A  . Number of Children: N/A  . Years of Education: N/A   Occupational History  . Not on file.   Social History Main Topics  . Smoking status: Current Some Day Smoker  . Smokeless tobacco: Never Used     Comment: Pt somkes occasionally  . Alcohol Use: Yes  . Drug Use: No  . Sexual Activity: Not on file    Other Topics Concern  . 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 Appendectomy      Family History  Problem Relation Age of Onset  . Hypertension Mother   . Prostate cancer Father     deceased age 79 secondary to prostate cancer  . Diabetes type II Sister     Allergies  Allergen Reactions  . Ace Inhibitors     ?cough  . Atorvastatin Nausea Only    "Dark urine"    Current Outpatient Prescriptions on File Prior to Visit  Medication Sig Dispense Refill  . amLODipine (NORVASC) 10 MG tablet TAKE 1 TABLET BY MOUTH EVERY DAY 90 tablet 1  . aspirin EC 81 MG tablet Take 81 mg by mouth daily.    . fenofibrate (TRICOR) 145 MG tablet TAKE 1 TABLET (145 MG TOTAL) BY MOUTH DAILY. 30 tablet 1  . losartan (COZAAR) 25 MG tablet Take 1 tablet (25 mg total) by mouth daily. 30 tablet 2  . Omega-3 Fatty Acids (FISH OIL) 1000 MG CAPS Take 2,000 mg by mouth 2 (two) times daily. Reported on 05/05/2015    . rosuvastatin (CRESTOR) 20 MG tablet Take 1 tablet (  20 mg total) by mouth daily. 30 tablet 3   No current facility-administered medications on file prior to visit.    BP 118/78 mmHg  Pulse 71  Temp(Src) 98.1 F (36.7 C) (Oral)  Resp 16  Ht 5\' 9"  (1.753 m)  Wt 216 lb (97.977 kg)  BMI 31.88 kg/m2  SpO2 98%       Objective:   Physical Exam  General Appearance- Not in acute distress.    Chest and Lung Exam Auscultation: Breath sounds:-Normal. Clear even and unlabored. Adventitious sounds:- No Adventitious sounds.  Cardiovascular Auscultation:Rythm - Regular, rate and rythm. Heart Sounds -Normal heart sounds.  Abdomen Inspection:-Inspection Normal.  Palpation/Perucssion: Palpation and Percussion of the abdomen reveal- Non Tender, No Rebound tenderness, No rigidity(Guarding) and No Palpable abdominal masses.  Liver:-Normal.  Spleen:- Normal.   Back No  mid lumbar spine tenderness to palpation. But mild si tenderness.   Lower ext neurologic  L5-S1 sensation intact bilaterally. Normal patellar reflexes bilaterally. No foot drop bilaterally.  Lt knee- moderate swelling. No warmth. Good rom. No crepitus. Lt lower leg- 1+ edema distal calf.  Neg homans sign.  Rt knee- no swollen.  Rt lower ext- no pedal edema.  Neg homans sign.        Assessment & Plan:  For your sciatic region pain recommend conservative exercise.(including try sleeping with pillow between legs)  For pain in sciatic area and knee rx diclofenac.  Please get your knee xray and [place] study now. I will call you with stat US results when those are in. Notify as well on xray when those are in.  Follow up in 7-10 days or as needed  Amorita Vanrossum, Percell Miller, [employer]

## 2015-08-21 NOTE — Patient Instructions (Signed)
For your sciatic region pain recommend conservative exercise.(including try sleeping with pillow between legs)  For pain in sciatic area and knee rx diclofenac.  Please get your knee xray and [place] study now. I will call you with stat US results when those are in. Notify as well on xray when those are in.  Follow up in 7-10 days or as needed

## 2015-08-26 ENCOUNTER — Telehealth: Payer: Self-pay | Admitting: Family

## 2015-08-26 NOTE — Telephone Encounter (Signed)
Relation to pt: self Call back number:[phone]   Reason for call:  Patient inquiring about lab and imaging results. Please advise

## 2015-08-26 NOTE — Telephone Encounter (Signed)
Notified pt and he voices understanding. See result note.  Pt is agreeable to proceed with referral.

## 2015-09-01 ENCOUNTER — Telehealth: Payer: Self-pay | Admitting: Family

## 2015-09-02 NOTE — Telephone Encounter (Signed)
Shiquita-- please call pt to schedule f/u with Melissa. He is due for a follow up with Melissa on 09/29/15 and will need fasting appt to follow up on cholesterol medication / dose.   Received refill request on Crestor, Pt should still have refill remaining from 06/04/15 Rx.  Denial sent to pharmacy.

## 2015-09-05 NOTE — Telephone Encounter (Signed)
Called pt left detailed vm informing of the below.

## 2015-09-20 ENCOUNTER — Other Ambulatory Visit: Payer: Self-pay | Admitting: Family

## 2015-11-24 ENCOUNTER — Encounter: Payer: Self-pay | Admitting: Family

## 2015-11-24 ENCOUNTER — Ambulatory Visit (INDEPENDENT_AMBULATORY_CARE_PROVIDER_SITE_OTHER): Payer: Managed Care, Other (non HMO) | Admitting: Family

## 2015-11-24 VITALS — BP 156/81 | HR 70 | Temp 98.1°F | Resp 16 | Ht 69.0 in | Wt 214.6 lb

## 2015-11-24 DIAGNOSIS — Z23 Encounter for immunization: Secondary | ICD-10-CM | POA: Diagnosis not present

## 2015-11-24 DIAGNOSIS — I10 Essential (primary) hypertension: Secondary | ICD-10-CM | POA: Diagnosis not present

## 2015-11-24 DIAGNOSIS — E781 Pure hyperglyceridemia: Secondary | ICD-10-CM | POA: Diagnosis not present

## 2015-11-24 DIAGNOSIS — R3915 Urgency of urination: Secondary | ICD-10-CM | POA: Diagnosis not present

## 2015-11-24 DIAGNOSIS — E785 Hyperlipidemia, unspecified: Secondary | ICD-10-CM

## 2015-11-24 DIAGNOSIS — E119 Type 2 diabetes mellitus without complications: Secondary | ICD-10-CM

## 2015-11-24 DIAGNOSIS — E1121 Type 2 diabetes mellitus with diabetic nephropathy: Secondary | ICD-10-CM

## 2015-11-24 DIAGNOSIS — N3000 Acute cystitis without hematuria: Secondary | ICD-10-CM

## 2015-11-24 DIAGNOSIS — F411 Generalized anxiety disorder: Secondary | ICD-10-CM

## 2015-11-24 LAB — POCT URINALYSIS DIPSTICK
Bilirubin, UA: NEGATIVE
Blood, UA: NEGATIVE
Glucose, UA: NEGATIVE
Ketones, UA: NEGATIVE
Nitrite, UA: NEGATIVE
PH UA: 6
PROTEIN UA: NEGATIVE
Spec Grav, UA: >=1.03
UROBILINOGEN UA: NEGATIVE

## 2015-11-24 MED ORDER — LOSARTAN POTASSIUM 25 MG PO TABS: 25.0000 mg | ORAL_TABLET | Freq: Every day | ORAL | 1 refills | Status: DC

## 2015-11-24 MED ORDER — AMLODIPINE BESYLATE 10 MG PO TABS: 10.0000 mg | ORAL_TABLET | Freq: Every day | ORAL | 1 refills | Status: DC

## 2015-11-24 MED ORDER — CIPROFLOXACIN HCL 500 MG PO TABS
500.0000 mg | ORAL_TABLET | Freq: Two times a day (BID) | ORAL | 0 refills | Status: DC
Start: 2015-11-24 — End: 2016-01-12

## 2015-11-24 MED ORDER — MELOXICAM 7.5 MG PO TABS: 7.5000 mg | ORAL_TABLET | Freq: Every day | ORAL | 0 refills | Status: DC

## 2015-11-24 MED ORDER — FENOFIBRATE 145 MG PO TABS: ORAL_TABLET | ORAL | 5 refills | Status: DC

## 2015-11-24 NOTE — Assessment & Plan Note (Signed)
Uncontrolled, restart bp meds. Obtain bmet.

## 2015-11-24 NOTE — Assessment & Plan Note (Signed)
Clinically stable.  Obtain a1c.   

## 2015-11-24 NOTE — Assessment & Plan Note (Signed)
Stable off of meds.  Monitor.  

## 2015-11-24 NOTE — Patient Instructions (Addendum)
Please complete lab work prior to leaving. Begin cipro for urinary tract infection. Call if symptoms worsen or of not resolved in 3 days.

## 2015-11-24 NOTE — Progress Notes (Signed)
Subjective:    Patient ID: Drew Harris, male    DOB: May 22, 1955, 60 y.o.   MRN: [id number]  HPI  Drew Harris is a 60 yr old male who presents today for follow up.  1) HTN- Reports that he ran out medication.  BP Readings from Last 3 Encounters:  11/24/15 (!) 156/81  08/21/15 118/78  06/02/15 120/82   2) Hyperlipidemia- ran out of fenofibrate.  Lab Results  Component Value Date   CHOL 143 06/02/2015   HDL 56.70 06/02/2015   LDLCALC 79 07/04/2012   LDLDIRECT 40.0 06/02/2015   TRIG 285.0 (H) 06/02/2015   CHOLHDL 3 06/02/2015   3)  DM2-  Lab Results  Component Value Date   HGBA1C 5.3 04/17/2015   HGBA1C 5.4 12/06/2014   HGBA1C 5.4 06/11/2014   Lab Results  Component Value Date   MICROALBUR 4.8 (H) 06/11/2014   LDLCALC 79 07/04/2012   CREATININE 0.71 06/02/2015   4) Anxiety- not currently on medication. Reports mood is good.  Sleeping well. Denies panic attacks.   5) Urinary frequency/urgency x 1 month- Notes strong odor.   Lab Results  Component Value Date   PSA 0.27 02/18/2012   PSA 0.29 02/12/2011   PSA 0.33 11/13/2010      Review of Systems  See HPI  Past Medical History:  Diagnosis Date  . Allergic rhinitis   . Borderline diabetes    Type 2  . Diabetes mellitus without complication ([place])   . ED (erectile dysfunction)   . Hyperlipidemia   . Hypertension      Social History   Social History  . Marital status: Single    Spouse name: N/A  . Number of children: N/A  . Years of education: N/A   Occupational History  . Not on file.   Social History Main Topics  . Smoking status: Current Some Day Smoker  . Smokeless tobacco: Never Used     Comment: Pt somkes occasionally  . Alcohol use Yes  . Drug use: No  . Sexual activity: Not on file   Other Topics Concern  . 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  APPENDECTOMY      Family History  Problem Relation Age of Onset  . Hypertension Mother   . Prostate cancer Father     deceased age 38 secondary to prostate cancer  . Diabetes type II Sister     Allergies  Allergen Reactions  . Ace Inhibitors     ?cough  . Atorvastatin Nausea Only    "Dark urine"    Current Outpatient Prescriptions on File Prior to Visit  Medication Sig Dispense Refill  . aspirin EC 81 MG tablet Take 81 mg by mouth daily.    . diclofenac (VOLTAREN) 75 MG EC tablet Take 1 tablet (75 mg total) by mouth 2 (two) times daily. 20 tablet 0  . fenofibrate (TRICOR) 145 MG tablet TAKE 1 TABLET (145 MG TOTAL) BY MOUTH DAILY. 30 tablet 2  . Omega-3 Fatty Acids (FISH OIL) 1000 MG CAPS Take 2,000 mg by mouth 2 (two) times daily. Reported on 05/05/2015     No current facility-administered medications on file prior to visit.     BP (!) 156/81 (BP Location: Right Arm, Cuff Size: Large)   Pulse 70   Temp 98.1 F (36.7 C) (Oral)  Resp 16   Ht 5\' 9"  (1.753 m)   Wt 214 lb 9.6 oz (97.3 kg)   SpO2 100% Comment: room air  BMI 31.69 kg/m        Objective:   Physical Exam  Constitutional: He is oriented to person, place, and time. He appears well-developed and well-nourished. No distress.  HENT:  Head: Normocephalic and atraumatic.  Cardiovascular: Normal rate and regular rhythm.   No murmur heard. Pulmonary/Chest: Effort normal and breath sounds normal. No respiratory distress. He has no wheezes. He has no rales.  Musculoskeletal: He exhibits no edema.  Neurological: He is alert and oriented to person, place, and time.  Skin: Skin is warm and dry.  Psychiatric: He has a normal mood and affect. His behavior is normal. Thought content normal.          Assessment & Plan:  Flu shot today.   UTI- + leuks noted on dip. Will rx with cipro, send urine for culture.   Advised pt to follow up in 4-6 weeks.

## 2015-11-24 NOTE — Progress Notes (Signed)
Pre visit review using our clinic review tool, if applicable. No additional management support is needed unless otherwise documented below in the visit note. 

## 2015-11-24 NOTE — Assessment & Plan Note (Signed)
Continue tricor, obtain flp.

## 2015-11-28 ENCOUNTER — Telehealth: Payer: Self-pay | Admitting: Family

## 2015-11-28 LAB — URINE CULTURE

## 2015-11-28 NOTE — Telephone Encounter (Signed)
Urine culture + for E coli, sensitive to cipro he was given. Please call if symptoms worsen or do not improve.

## 2015-11-28 NOTE — Telephone Encounter (Signed)
Notified pt and he voices understanding. 

## 2015-11-28 NOTE — Telephone Encounter (Signed)
Left message for pt to return my call.

## 2015-12-27 ENCOUNTER — Other Ambulatory Visit: Payer: Self-pay | Admitting: Family

## 2015-12-30 NOTE — Telephone Encounter (Signed)
Last Crestor Rx, 06/04/15. Medication is no longer on current med list.  Labs from 11/24/15 are not showing in EPIC. I have sent message to the lab to follow up on status?  Please advise if pt should still be taking medication?

## 2016-01-06 ENCOUNTER — Encounter: Payer: Self-pay | Admitting: Family

## 2016-01-06 ENCOUNTER — Other Ambulatory Visit (INDEPENDENT_AMBULATORY_CARE_PROVIDER_SITE_OTHER): Payer: Managed Care, Other (non HMO)

## 2016-01-06 DIAGNOSIS — R3915 Urgency of urination: Secondary | ICD-10-CM | POA: Diagnosis not present

## 2016-01-06 DIAGNOSIS — E781 Pure hyperglyceridemia: Secondary | ICD-10-CM | POA: Diagnosis not present

## 2016-01-06 DIAGNOSIS — E1121 Type 2 diabetes mellitus with diabetic nephropathy: Secondary | ICD-10-CM | POA: Diagnosis not present

## 2016-01-06 DIAGNOSIS — Z23 Encounter for immunization: Secondary | ICD-10-CM

## 2016-01-06 LAB — BASIC METABOLIC PANEL
BUN: 20 mg/dL (ref 6–23)
CALCIUM: 9.3 mg/dL (ref 8.4–10.5)
CHLORIDE: 106 meq/L (ref 96–112)
CO2: 24 mEq/L (ref 19–32)
CREATININE: 0.7 mg/dL (ref 0.40–1.50)
GFR: 148.57 mL/min (ref >60.00)
Glucose, Bld: 103 mg/dL — ABNORMAL HIGH (ref 70–99)
Potassium: 3.9 mEq/L (ref 3.5–5.1)
Sodium: 141 mEq/L (ref 135–145)

## 2016-01-06 LAB — LIPID PANEL
Cholesterol: 160 mg/dL (ref 0–200)
HDL: 60.7 mg/dL (ref >39.00)
LDL Cholesterol: 60 mg/dL (ref 0–99)
NonHDL: 99.6
Total CHOL/HDL Ratio: 3
Triglycerides: 197 mg/dL — ABNORMAL HIGH (ref 0.0–149.0)
VLDL: 39.4 mg/dL (ref 0.0–40.0)

## 2016-01-06 LAB — HEMOGLOBIN A1C: HEMOGLOBIN A1C: 5.6 % (ref 4.6–6.5)

## 2016-01-06 LAB — PSA: PSA: 1.21 ng/mL (ref 0.10–4.00)

## 2016-01-12 ENCOUNTER — Encounter: Payer: Self-pay | Admitting: Family

## 2016-01-12 ENCOUNTER — Ambulatory Visit (INDEPENDENT_AMBULATORY_CARE_PROVIDER_SITE_OTHER): Payer: Managed Care, Other (non HMO) | Admitting: Family

## 2016-01-12 VITALS — BP 129/79 | HR 81 | Temp 98.5°F | Resp 18 | Ht 69.0 in | Wt 218.0 lb

## 2016-01-12 DIAGNOSIS — E118 Type 2 diabetes mellitus with unspecified complications: Secondary | ICD-10-CM | POA: Diagnosis not present

## 2016-01-12 DIAGNOSIS — R8290 Unspecified abnormal findings in urine: Secondary | ICD-10-CM | POA: Diagnosis not present

## 2016-01-12 DIAGNOSIS — I10 Essential (primary) hypertension: Secondary | ICD-10-CM

## 2016-01-12 DIAGNOSIS — E785 Hyperlipidemia, unspecified: Secondary | ICD-10-CM | POA: Diagnosis not present

## 2016-01-12 NOTE — Assessment & Plan Note (Signed)
Improved, continue current medications.

## 2016-01-12 NOTE — Patient Instructions (Signed)
Please complete lab work prior to leaving.   

## 2016-01-12 NOTE — Assessment & Plan Note (Signed)
BP stable on current medications. Continue same.  

## 2016-01-12 NOTE — Assessment & Plan Note (Signed)
A1C at goal.  + microalbuminuria- on ARB. Continue ASA for cardiac prevention.

## 2016-01-12 NOTE — Progress Notes (Signed)
Pre visit review using our clinic review tool, if applicable. No additional management support is needed unless otherwise documented below in the visit note. 

## 2016-01-12 NOTE — Progress Notes (Signed)
Subjective:    Patient ID: Drew Harris, male    DOB: 27-Feb-1956, 60 y.o.   MRN: [id number]  HPI  Drew Harris is a 60 yr old male who presents today for follow up.     HTN- last visit he was not on his BP meds and he was advised to restart.  BP Readings from Last 3 Encounters:  01/12/16 129/79  11/24/15 (!) 156/81  08/21/15 118/78   Diabetes- diet controlled.  Lab Results  Component Value Date   HGBA1C 5.6 01/06/2016   HGBA1C 5.3 04/17/2015   HGBA1C 5.4 12/06/2014   Lab Results  Component Value Date   MICROALBUR 4.8 (H) 06/11/2014   LDLCALC 60 01/06/2016   CREATININE 0.70 01/06/2016   Hyperlipidemia- Trigs were as high as 1400 nine months ago.  On crestor, fenofibrate and fish oil.  Lab Results  Component Value Date   CHOL 160 01/06/2016   HDL 60.70 01/06/2016   LDLCALC 60 01/06/2016   LDLDIRECT 40.0 06/02/2015   TRIG 197.0 (H) 01/06/2016   CHOLHDL 3 01/06/2016   Notes occasional "bad odor" of urine.  Denies dysuria.  Review of Systems See HPI  Past Medical History:  Diagnosis Date  . Allergic rhinitis   . Borderline diabetes    Type 2  . Diabetes mellitus without complication ([place])   . ED (erectile dysfunction)   . Hyperlipidemia   . Hypertension      Social History   Social History  . Marital status: Single    Spouse name: N/A  . Number of children: N/A  . Years of education: N/A   Occupational History  . Not on file.   Social History Main Topics  . Smoking status: Current Some Day Smoker  . Smokeless tobacco: Never Used     Comment: Pt somkes occasionally  . Alcohol use Yes  . Drug use: No  . Sexual activity: Not on file   Other Topics Concern  . 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 APPENDECTOMY      Family History  Problem Relation Age of Onset  . Hypertension Mother   . Prostate cancer Father    deceased age 68 secondary to prostate cancer  . Diabetes type II Sister     Allergies  Allergen Reactions  . Ace Inhibitors     ?cough  . Atorvastatin Nausea Only    "Dark urine"    Current Outpatient Prescriptions on File Prior to Visit  Medication Sig Dispense Refill  . amLODipine (NORVASC) 10 MG tablet Take 1 tablet (10 mg total) by mouth daily. 90 tablet 1  . aspirin EC 81 MG tablet Take 81 mg by mouth daily.    . fenofibrate (TRICOR) 145 MG tablet TAKE 1 TABLET (145 MG TOTAL) BY MOUTH DAILY. 30 tablet 5  . losartan (COZAAR) 25 MG tablet Take 1 tablet (25 mg total) by mouth daily. 90 tablet 1  . Omega-3 Fatty Acids (FISH OIL) 1000 MG CAPS Take 2,000 mg by mouth 2 (two) times daily. Reported on 05/05/2015    . rosuvastatin (CRESTOR) 20 MG tablet TAKE 1 TABLET (20 MG TOTAL) BY MOUTH DAILY. 30 tablet 3   No current facility-administered medications on file prior to visit.     BP 129/79 (BP Location: Right Arm, Cuff Size: Large)   Pulse 81  Temp 98.5 F (36.9 C) (Oral)   Resp 18   Ht 5\' 9"  (1.753 m)   Wt 218 lb (98.9 kg)   SpO2 100% Comment: room air  BMI 32.19 kg/m       Objective:   Physical Exam  Constitutional: He is oriented to person, place, and time. He appears well-developed and well-nourished. No distress.  HENT:  Head: Normocephalic and atraumatic.  Cardiovascular: Normal rate and regular rhythm.   No murmur heard. Pulmonary/Chest: Effort normal and breath sounds normal. No respiratory distress. He has no wheezes. He has no rales.  Musculoskeletal: He exhibits no edema.  Neurological: He is alert and oriented to person, place, and time.  Skin: Skin is warm and dry.  Psychiatric: He has a normal mood and affect. His behavior is normal. Thought content normal.          Assessment & Plan:  Urinary odor-  Will obtain urine culture to rule out infection.

## 2016-01-14 LAB — URINE CULTURE: Organism ID, Bacteria: NO GROWTH

## 2016-03-31 ENCOUNTER — Other Ambulatory Visit: Payer: Self-pay | Admitting: Family

## 2016-04-12 ENCOUNTER — Ambulatory Visit: Payer: Managed Care, Other (non HMO) | Admitting: Family

## 2016-04-19 ENCOUNTER — Ambulatory Visit: Payer: Managed Care, Other (non HMO) | Admitting: Family

## 2016-05-03 ENCOUNTER — Ambulatory Visit (INDEPENDENT_AMBULATORY_CARE_PROVIDER_SITE_OTHER): Payer: Managed Care, Other (non HMO) | Admitting: Family

## 2016-05-03 ENCOUNTER — Encounter: Payer: Self-pay | Admitting: Family

## 2016-05-03 VITALS — BP 141/83 | HR 83 | Temp 98.4°F | Resp 16 | Ht 69.0 in | Wt 217.2 lb

## 2016-05-03 DIAGNOSIS — I10 Essential (primary) hypertension: Secondary | ICD-10-CM

## 2016-05-03 DIAGNOSIS — E1169 Type 2 diabetes mellitus with other specified complication: Secondary | ICD-10-CM

## 2016-05-03 DIAGNOSIS — E785 Hyperlipidemia, unspecified: Secondary | ICD-10-CM | POA: Diagnosis not present

## 2016-05-03 DIAGNOSIS — E119 Type 2 diabetes mellitus without complications: Secondary | ICD-10-CM

## 2016-05-03 DIAGNOSIS — E118 Type 2 diabetes mellitus with unspecified complications: Secondary | ICD-10-CM

## 2016-05-03 MED ORDER — SILDENAFIL CITRATE 50 MG PO TABS: 25.0000 mg | ORAL_TABLET | Freq: Every day | ORAL | 5 refills | Status: DC | PRN

## 2016-05-03 MED ORDER — AMLODIPINE BESYLATE 10 MG PO TABS: 10.0000 mg | ORAL_TABLET | Freq: Every day | ORAL | 1 refills | Status: DC

## 2016-05-03 MED ORDER — LOSARTAN POTASSIUM 25 MG PO TABS: 25.0000 mg | ORAL_TABLET | Freq: Every day | ORAL | 1 refills | Status: DC

## 2016-05-03 NOTE — Assessment & Plan Note (Signed)
Maintained on crestor. LDL at goal, continue crestor.

## 2016-05-03 NOTE — Patient Instructions (Addendum)
Please complete lab work prior to leaving.   

## 2016-05-03 NOTE — Assessment & Plan Note (Signed)
Stable on diabetic diet, obtain follow up A1c.

## 2016-05-03 NOTE — Progress Notes (Signed)
Pre visit review using our clinic review tool, if applicable. No additional management support is needed unless otherwise documented below in the visit note. 

## 2016-05-03 NOTE — Assessment & Plan Note (Signed)
BP stable on current medications. Continue same, obtain follow up bmet.

## 2016-05-03 NOTE — Progress Notes (Signed)
Subjective:    Patient ID: Drew Harris, male    DOB: 1955/03/26, 61 y.o.   MRN: [id number]  HPI  Drew Harris is a 61 yr old male who presents today for follow up.  1) HTN- continues amlodipine and losartan. Denies CP/SOB.  BP Readings from Last 3 Encounters:  05/03/16 (!) 141/83  01/12/16 129/79  11/24/15 (!) 156/81   2) DM2- he is maintained on diet alone.  Lab Results  Component Value Date   HGBA1C 5.6 01/06/2016   HGBA1C 5.3 04/17/2015   HGBA1C 5.4 12/06/2014   Lab Results  Component Value Date   MICROALBUR 4.8 (H) 06/11/2014   LDLCALC 60 01/06/2016   CREATININE 0.70 01/06/2016   3) Hyperlipidemia- on fenofibrate and crestor.  Review of Systems    see HPI  Past Medical History:  Diagnosis Date  . Allergic rhinitis   . Borderline diabetes    Type 2  . Diabetes mellitus without complication ([place])   . ED (erectile dysfunction)   . Hyperlipidemia   . Hypertension      Social History   Social History  . Marital status: Single    Spouse name: N/A  . Number of children: N/A  . Years of education: N/A   Occupational History  . Not on file.   Social History Main Topics  . Smoking status: Current Some Day Smoker  . Smokeless tobacco: Never Used     Comment: Pt somkes occasionally  . Alcohol use Yes  . Drug use: No  . Sexual activity: Not on file   Other Topics Concern  . 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 APPENDECTOMY      Family History  Problem Relation Age of Onset  . Hypertension Mother   . Prostate cancer Father     deceased age 37 secondary to prostate cancer  . Diabetes type II Sister     Allergies  Allergen Reactions  . Ace Inhibitors     ?cough  . Atorvastatin Nausea Only    "Dark urine"    Current Outpatient Prescriptions on File Prior to Visit  Medication Sig Dispense Refill  . amLODipine  (NORVASC) 10 MG tablet Take 1 tablet (10 mg total) by mouth daily. 90 tablet 1  . aspirin EC 81 MG tablet Take 81 mg by mouth daily.    . fenofibrate (TRICOR) 145 MG tablet TAKE 1 TABLET (145 MG TOTAL) BY MOUTH DAILY. 30 tablet 5  . losartan (COZAAR) 25 MG tablet Take 1 tablet (25 mg total) by mouth daily. 90 tablet 1  . Omega-3 Fatty Acids (FISH OIL) 1000 MG CAPS Take 2,000 mg by mouth 2 (two) times daily. Reported on 05/05/2015    . rosuvastatin (CRESTOR) 20 MG tablet TAKE 1 TABLET BY MOUTH EVERY DAY 30 tablet 5   No current facility-administered medications on file prior to visit.     BP (!) 141/83 (BP Location: Right Arm, Cuff Size: Large)   Pulse 83   Temp 98.4 F (36.9 C) (Oral)   Resp 16   Ht 5\' 9"  (1.753 m)   Wt 217 lb 3.2 oz (98.5 kg)   SpO2 100% Comment: room air  BMI 32.07 kg/m    Objective:   Physical Exam  Constitutional: He is oriented to person, place, and time. He appears well-developed and well-nourished.  No distress.  HENT:  Head: Normocephalic and atraumatic.  Cardiovascular: Normal rate and regular rhythm.   No murmur heard. Pulmonary/Chest: Effort normal and breath sounds normal. No respiratory distress. He has no wheezes. He has no rales.  Musculoskeletal: He exhibits no edema.  Neurological: He is alert and oriented to person, place, and time.  Skin: Skin is warm and dry.  Psychiatric: He has a normal mood and affect. His behavior is normal. Thought content normal.          Assessment & Plan:

## 2016-05-04 LAB — BASIC METABOLIC PANEL
BUN: 20 mg/dL (ref 6–23)
CALCIUM: 9.8 mg/dL (ref 8.4–10.5)
CO2: 24 mEq/L (ref 19–32)
Chloride: 104 mEq/L (ref 96–112)
Creatinine, Ser: 0.83 mg/dL (ref 0.40–1.50)
GFR: 121.92 mL/min (ref >60.00)
GLUCOSE: 93 mg/dL (ref 70–99)
Potassium: 3.7 mEq/L (ref 3.5–5.1)
SODIUM: 139 meq/L (ref 135–145)

## 2016-05-04 LAB — HEMOGLOBIN A1C: HEMOGLOBIN A1C: 5.9 % (ref 4.6–6.5)

## 2016-05-05 ENCOUNTER — Encounter: Payer: Self-pay | Admitting: Family

## 2016-05-12 ENCOUNTER — Telehealth: Payer: Self-pay | Admitting: Family

## 2016-05-12 DIAGNOSIS — E1169 Type 2 diabetes mellitus with other specified complication: Secondary | ICD-10-CM

## 2016-05-12 DIAGNOSIS — E785 Hyperlipidemia, unspecified: Principal | ICD-10-CM

## 2016-05-12 NOTE — Telephone Encounter (Signed)
°  Relation to QN:[id number] Call back number: [phone] Pharmacy: [facility]  Reason for call: pt was seen on 2/26 and states that Melissa was going to call in a rx for his diabetes pt states that the pharmacy does not have the rx. Please advise.

## 2016-05-13 NOTE — Telephone Encounter (Signed)
Does not need DM med. ok to refill existing meds if needed. Tks.

## 2016-05-13 NOTE — Telephone Encounter (Signed)
Please advise 

## 2016-05-14 NOTE — Telephone Encounter (Signed)
Left detailed message on pt's voicemail and to call if any further questions.  

## 2016-06-28 ENCOUNTER — Telehealth: Payer: Self-pay | Admitting: *Deleted

## 2016-06-28 MED ORDER — FENOFIBRATE 145 MG PO TABS: ORAL_TABLET | ORAL | 5 refills | Status: DC

## 2016-06-28 NOTE — Telephone Encounter (Signed)
[facility] 145mg . Refills sent.

## 2016-08-30 ENCOUNTER — Encounter: Payer: Self-pay | Admitting: Family

## 2016-08-30 ENCOUNTER — Ambulatory Visit (INDEPENDENT_AMBULATORY_CARE_PROVIDER_SITE_OTHER): Payer: 59 | Admitting: Family

## 2016-08-30 VITALS — BP 138/80 | HR 77 | Temp 98.3°F | Resp 18 | Ht 69.0 in | Wt 216.6 lb

## 2016-08-30 DIAGNOSIS — R809 Proteinuria, unspecified: Secondary | ICD-10-CM

## 2016-08-30 DIAGNOSIS — E1129 Type 2 diabetes mellitus with other diabetic kidney complication: Secondary | ICD-10-CM | POA: Diagnosis not present

## 2016-08-30 DIAGNOSIS — I10 Essential (primary) hypertension: Secondary | ICD-10-CM | POA: Diagnosis not present

## 2016-08-30 DIAGNOSIS — M25511 Pain in right shoulder: Secondary | ICD-10-CM

## 2016-08-30 DIAGNOSIS — Z794 Long term (current) use of insulin: Secondary | ICD-10-CM | POA: Diagnosis not present

## 2016-08-30 MED ORDER — MELOXICAM 7.5 MG PO TABS: 7.5000 mg | ORAL_TABLET | Freq: Every day | ORAL | 0 refills | Status: DC

## 2016-08-30 NOTE — Patient Instructions (Addendum)
Please complete lab work prior to leaving.  Start meloxicam once daily for next 1-2 weeks for shoulder pain. You will be contacted about your referral to sports medicine for your right shoulder pain.

## 2016-08-30 NOTE — Progress Notes (Signed)
Subjective:    Patient ID: Drew Harris, male    DOB: July 05, 1955, 61 y.o.   MRN: [id number]  HPI  Drew Harris is a 61 yr old male who presents today for follow up.  1) DM2- diet controlled.  Lab Results  Component Value Date   HGBA1C 5.9 05/03/2016   HGBA1C 5.6 01/06/2016   HGBA1C 5.3 04/17/2015   Lab Results  Component Value Date   MICROALBUR 4.8 (H) 06/11/2014   LDLCALC 60 01/06/2016   CREATININE 0.83 05/03/2016   2) HTN- maintained on losartan 25mg . Denies swelling.   BP Readings from Last 3 Encounters:  08/30/16 138/80  05/03/16 (!) 141/83  01/12/16 129/79   3) Shoulder pain- has been present x 1 week. He reports that he has not had known injury.  Pain with movement.  He has been  Using ibuprofen prn with some improvement.   Review of Systems See HPI  Past Medical History:  Diagnosis Date  . Allergic rhinitis   . Borderline diabetes    Type 2  . Diabetes mellitus without complication ([place])   . ED (erectile dysfunction)   . Hyperlipidemia   . Hypertension      Social History   Social History  . Marital status: Single    Spouse name: N/A  . Number of children: N/A  . Years of education: N/A   Occupational History  . Not on file.   Social History Main Topics  . Smoking status: Current Some Day Smoker  . Smokeless tobacco: Never Used     Comment: Pt somkes occasionally  . Alcohol use Yes  . Drug use: No  . Sexual activity: Not on file   Other Topics Concern  . 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 APPENDECTOMY      Family History  Problem Relation Age of Onset  . Hypertension Mother   . Prostate cancer Father        deceased age 60 secondary to prostate cancer  . Diabetes type II Sister     Allergies  Allergen Reactions  . Ace Inhibitors     ?cough  . Atorvastatin Nausea Only    "Dark urine"    Current  Outpatient Prescriptions on File Prior to Visit  Medication Sig Dispense Refill  . amLODipine (NORVASC) 10 MG tablet Take 1 tablet (10 mg total) by mouth daily. 90 tablet 1  . aspirin EC 81 MG tablet Take 81 mg by mouth daily.    . fenofibrate (TRICOR) 145 MG tablet TAKE 1 TABLET (145 MG TOTAL) BY MOUTH DAILY. 30 tablet 5  . losartan (COZAAR) 25 MG tablet Take 1 tablet (25 mg total) by mouth daily. 90 tablet 1  . Omega-3 Fatty Acids (FISH OIL) 1000 MG CAPS Take 2,000 mg by mouth 2 (two) times daily. Reported on 05/05/2015    . rosuvastatin (CRESTOR) 20 MG tablet TAKE 1 TABLET BY MOUTH EVERY DAY 30 tablet 5  . sildenafil (VIAGRA) 50 MG tablet Take 0.5 tablets (25 mg total) by mouth daily as needed for erectile dysfunction. 10 tablet 5   No current facility-administered medications on file prior to visit.     BP 138/80 (BP Location: Right Arm, Cuff Size: Large)   Pulse 77   Temp 98.3 F (36.8 C) (Oral)   Resp 18   Ht  5\' 9"  (1.753 m)   Wt 216 lb 9.6 oz (98.2 kg)   SpO2 100%   BMI 31.99 kg/m       Objective:   Physical Exam  Constitutional: He is oriented to person, place, and time. He appears well-developed and well-nourished. No distress.  HENT:  Head: Normocephalic and atraumatic.  Cardiovascular: Normal rate and regular rhythm.   No murmur heard. Pulmonary/Chest: Effort normal and breath sounds normal. No respiratory distress. He has no wheezes. He has no rales.  Musculoskeletal: He exhibits no edema.  Unable to adduct the right arm  Neurological: He is alert and oriented to person, place, and time.  Skin: Skin is warm and dry.  Psychiatric: He has a normal mood and affect. His behavior is normal. Thought content normal.          Assessment & Plan:  Shoulder pain- I am concerned about rotator cuff tear. Will refer to sports medicine for further evaluation. In the meantime, begin meloxicam short term.   HTN- bp stable on current meds. Continue same, obtain follow up  bmet.  DM2- clinically stable. Obtain follow up A1C. Continue diabetic diet.

## 2016-08-31 ENCOUNTER — Encounter: Payer: Self-pay | Admitting: Family

## 2016-08-31 LAB — BASIC METABOLIC PANEL
BUN: 18 mg/dL (ref 6–23)
CO2: 22 mEq/L (ref 19–32)
Calcium: 9.3 mg/dL (ref 8.4–10.5)
Chloride: 106 mEq/L (ref 96–112)
Creatinine, Ser: 0.79 mg/dL (ref 0.40–1.50)
GFR: 128.92 mL/min (ref >60.00)
Glucose, Bld: 96 mg/dL (ref 70–99)
POTASSIUM: 3.8 meq/L (ref 3.5–5.1)
SODIUM: 140 meq/L (ref 135–145)

## 2016-08-31 LAB — HEMOGLOBIN A1C: HEMOGLOBIN A1C: 5.9 % (ref 4.6–6.5)

## 2016-09-06 ENCOUNTER — Ambulatory Visit (INDEPENDENT_AMBULATORY_CARE_PROVIDER_SITE_OTHER): Payer: 59 | Admitting: Family Medicine

## 2016-09-06 ENCOUNTER — Encounter: Payer: Self-pay | Admitting: Family Medicine

## 2016-09-06 DIAGNOSIS — M25511 Pain in right shoulder: Secondary | ICD-10-CM | POA: Insufficient documentation

## 2016-09-06 NOTE — Assessment & Plan Note (Signed)
history, exam, ultrasound consistent with impingement and partial supraspinatus tear.  While he has a partial subscap tear as well this is clinically not apparent, likely old.  Should do well with conservative treatment.  Start with home exercise program, continue meloxicam.  Discussed nitro (but he takes viagra - would need 48 hours on either side of medication to avoid interaction) - will wait on this.  Consider physical therapy.  F/u in 6 weeks.

## 2016-09-06 NOTE — Patient Instructions (Signed)
You have small partial tears of two rotator cuff muscles (supraspinatus and subscapularis). These should heal well with conservative treatment. Try to avoid painful activities (overhead activities, lifting with extended arm) as much as possible. Meloxicam daily with food for pain and inflammation. Can take tylenol in addition to this. Subacromial injection may be beneficial to help with pain and to decrease inflammation. Consider physical therapy with transition to home exercise program. Start motion exercises (arm circles, arm swings, and table slides - 3 sets of 10 once or twice a day). After 1-2 weeks Do home exercise program with theraband and scapular stabilization exercises daily - start with 1 set of 10 once a day but eventually work your way up to 3 sets of 10 once a day. If not improving at follow-up we will consider physical therapy, nitro patches, injection. Follow up with me in 6 weeks.

## 2016-09-06 NOTE — Progress Notes (Signed)
PCP and consultation requested by: Debbrah Alar, NP  Subjective:   HPI: Patient is a 61 y.o. male here for right shoulder pain.  Patient denies known injury or trauma. He states for about 2 months he's had lateral right shoulder pain. Thought maybe he slept on right shoulder wrong. He is right handed. Pain level is 5/10, up to 8/10 and sharp. Worse with overhead motions, reaching. Pain can be throbbing at times. Taking meloxicam with some benefit. Ok at rest. No skin changes, numbness.  Past Medical History:  Diagnosis Date  . Allergic rhinitis   . Borderline diabetes    Type 2  . Diabetes mellitus without complication ([place])   . ED (erectile dysfunction)   . Hyperlipidemia   . Hypertension     Current Outpatient Prescriptions on File Prior to Visit  Medication Sig Dispense Refill  . amLODipine (NORVASC) 10 MG tablet Take 1 tablet (10 mg total) by mouth daily. 90 tablet 1  . aspirin EC 81 MG tablet Take 81 mg by mouth daily.    . fenofibrate (TRICOR) 145 MG tablet TAKE 1 TABLET (145 MG TOTAL) BY MOUTH DAILY. 30 tablet 5  . losartan (COZAAR) 25 MG tablet Take 1 tablet (25 mg total) by mouth daily. 90 tablet 1  . meloxicam (MOBIC) 7.5 MG tablet Take 1 tablet (7.5 mg total) by mouth daily. 14 tablet 0  . Omega-3 Fatty Acids (FISH OIL) 1000 MG CAPS Take 2,000 mg by mouth 2 (two) times daily. Reported on 05/05/2015    . rosuvastatin (CRESTOR) 20 MG tablet TAKE 1 TABLET BY MOUTH EVERY DAY 30 tablet 5  . sildenafil (VIAGRA) 50 MG tablet Take 0.5 tablets (25 mg total) by mouth daily as needed for erectile dysfunction. 10 tablet 5   No current facility-administered medications on file prior to visit.     Past Surgical History:  Procedure Laterality Date  . APPENDECTOMY      Allergies  Allergen Reactions  . Ace Inhibitors     ?cough  . Atorvastatin Nausea Only    "Dark urine"    Social History   Social History  . Marital status: Single    Spouse name: N/A  .  Number of children: N/A  . Years of education: N/A   Occupational History  . Not on file.   Social History Main Topics  . Smoking status: Current Some Day Smoker  . Smokeless tobacco: Never Used     Comment: Pt somkes occasionally  . Alcohol use Yes  . Drug use: No  . Sexual activity: Not on file   Other Topics Concern  . 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 Hypertension Mother   . Prostate cancer Father        deceased age 70 secondary to prostate cancer  . Diabetes type II Sister     BP 131/83   Pulse 76   Ht 5\' 8"  (1.727 m)   Wt 213 lb (96.6 kg)   BMI 32.39 kg/m   Review of Systems: See HPI above.     Objective:  Physical Exam:  Gen: NAD, comfortable in exam room  Right shoulder: No swelling, ecchymoses.  No gross deformity. No TTP. Abduction and flexion to 90 degrees, painful arc.  Full IR and ER. Positive Hawkins, Neers. Negative Yergasons. Strength 4/5 with empty can  and 5/5 resisted internal/external rotation.  Pain only with empty can. NV intact distally.  Left shoulder: FROM without pain.  MSK u/s right shoulder:  Biceps tendon with some intrasubstance degeneration - no subluxation, tenosynovitis.  AC joint with mild arthropathy.  Subscapularis with calcification near insertion and ~25% partial tear.  Infraspinatus appears normal.  No subacromial bursitis.  Supraspinatus with small intratendinous tear with about 90% of muscle, tendon intact.     Assessment & Plan:  1. Right shoulder pain - history, exam, ultrasound consistent with impingement and partial supraspinatus tear.  While he has a partial subscap tear as well this is clinically not apparent, likely old.  Should do well with conservative treatment.  Start with home exercise program, continue meloxicam.  Discussed nitro (but he takes viagra - would need 48 hours on either side  of medication to avoid interaction) - will wait on this.  Consider physical therapy.  F/u in 6 weeks.

## 2016-09-27 ENCOUNTER — Ambulatory Visit: Payer: 59 | Admitting: Family

## 2016-10-01 ENCOUNTER — Other Ambulatory Visit: Payer: Self-pay | Admitting: Family

## 2016-10-18 ENCOUNTER — Ambulatory Visit: Payer: 59 | Admitting: Family Medicine

## 2016-11-09 ENCOUNTER — Other Ambulatory Visit: Payer: Self-pay | Admitting: Family

## 2016-12-27 ENCOUNTER — Ambulatory Visit (INDEPENDENT_AMBULATORY_CARE_PROVIDER_SITE_OTHER): Payer: 59 | Admitting: Family

## 2016-12-27 ENCOUNTER — Encounter: Payer: Self-pay | Admitting: Family

## 2016-12-27 VITALS — BP 138/85 | HR 88 | Temp 98.2°F | Resp 18 | Ht 69.0 in | Wt 221.4 lb

## 2016-12-27 DIAGNOSIS — E785 Hyperlipidemia, unspecified: Secondary | ICD-10-CM | POA: Diagnosis not present

## 2016-12-27 DIAGNOSIS — E119 Type 2 diabetes mellitus without complications: Secondary | ICD-10-CM

## 2016-12-27 DIAGNOSIS — F528 Other sexual dysfunction not due to a substance or known physiological condition: Secondary | ICD-10-CM

## 2016-12-27 DIAGNOSIS — I10 Essential (primary) hypertension: Secondary | ICD-10-CM | POA: Diagnosis not present

## 2016-12-27 DIAGNOSIS — Z23 Encounter for immunization: Secondary | ICD-10-CM | POA: Diagnosis not present

## 2016-12-27 DIAGNOSIS — E118 Type 2 diabetes mellitus with unspecified complications: Secondary | ICD-10-CM

## 2016-12-27 NOTE — Assessment & Plan Note (Signed)
Trigs were elevated last visit. Will obtain follow up lipid panel. Continue current meds.

## 2016-12-27 NOTE — Assessment & Plan Note (Signed)
No response to 25mg  of viagra, advised pt to try 50mg .

## 2016-12-27 NOTE — Assessment & Plan Note (Signed)
Stable, continue current meds 

## 2016-12-27 NOTE — Patient Instructions (Signed)
Please complete lab work prior to leaving.   

## 2016-12-27 NOTE — Assessment & Plan Note (Signed)
Obtain follow up A1C.   

## 2016-12-27 NOTE — Progress Notes (Signed)
Subjective:    Patient ID: Drew Harris, male    DOB: 20-Nov-1955, 61 y.o.   MRN: [id number]  HPI  Patient is a 61 year old male who presents today for follow-up.  Diabetes type 2-this is diet controlled. Lab Results  Component Value Date   HGBA1C 5.9 08/30/2016   HGBA1C 5.9 05/03/2016   HGBA1C 5.6 01/06/2016   Lab Results  Component Value Date   MICROALBUR 4.8 (H) 06/11/2014   LDLCALC 60 01/06/2016   CREATININE 0.79 08/30/2016   Hypertension- antihypertensive medications include amlodipine 10 mg, losartan 25 mg. BP Readings from Last 3 Encounters:  09/06/16 131/83  08/30/16 138/80  05/03/16 (!) 141/83   Hyperlipidemia-the patient is maintained on Crestor 20 mg tablets. He is also maintained on TriCor 145 mg.  Lab Results  Component Value Date   CHOL 160 01/06/2016   HDL 60.70 01/06/2016   LDLCALC 60 01/06/2016   LDLDIRECT 40.0 06/02/2015   TRIG 197.0 (H) 01/06/2016   CHOLHDL 3 01/06/2016    ED- did not have success with viagra 25mg .    Review of Systems  Respiratory: Negative for shortness of breath.   Cardiovascular: Negative for chest pain and leg swelling.   See HPI  Past Medical History:  Diagnosis Date  . Allergic rhinitis   . Borderline diabetes    Type 2  . Diabetes mellitus without complication ([place])   . ED (erectile dysfunction)   . Hyperlipidemia   . Hypertension      Social History   Social History  . Marital status: Single    Spouse name: N/A  . Number of children: N/A  . Years of education: N/A   Occupational History  . Not on file.   Social History Main Topics  . Smoking status: Current Some Day Smoker  . Smokeless tobacco: Never Used     Comment: Pt somkes occasionally  . Alcohol use Yes  . Drug use: No  . Sexual activity: Not on file   Other Topics Concern  . 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 APPENDECTOMY      Family History  Problem Relation Age of Onset  . Hypertension Mother   . Prostate cancer Father        deceased age 110 secondary to prostate cancer  . Diabetes type II Sister     Allergies  Allergen Reactions  . Ace Inhibitors     ?cough  . Atorvastatin Nausea Only    "Dark urine"    Current Outpatient Prescriptions on File Prior to Visit  Medication Sig Dispense Refill  . amLODipine (NORVASC) 10 MG tablet Take 1 tablet (10 mg total) by mouth daily. 90 tablet 1  . aspirin EC 81 MG tablet Take 81 mg by mouth daily.    . fenofibrate (TRICOR) 145 MG tablet TAKE 1 TABLET BY MOUTH EVERY DAY 30 tablet 1  . losartan (COZAAR) 25 MG tablet Take 1 tablet (25 mg total) by mouth daily. 90 tablet 1  . meloxicam (MOBIC) 7.5 MG tablet Take 1 tablet (7.5 mg total) by mouth daily. 14 tablet 0  . Omega-3 Fatty Acids (FISH OIL) 1000 MG CAPS Take 2,000 mg by mouth 2 (two) times daily. Reported on 05/05/2015    . rosuvastatin (CRESTOR) 20 MG tablet TAKE 1 TABLET BY MOUTH EVERY DAY 30 tablet  5  . sildenafil (VIAGRA) 50 MG tablet Take 0.5 tablets (25 mg total) by mouth daily as needed for erectile dysfunction. 10 tablet 5   No current facility-administered medications on file prior to visit.     Pulse 88   Temp 98.2 F (36.8 C) (Oral)   Resp 18   Ht 5\' 9"  (1.753 m)   Wt 221 lb 6.4 oz (100.4 kg)   SpO2 99%   BMI 32.70 kg/m       Objective:   Physical Exam  Constitutional: He is oriented to person, place, and time. He appears well-developed and well-nourished. No distress.  HENT:  Head: Normocephalic and atraumatic.  Cardiovascular: Normal rate and regular rhythm.   No murmur heard. Pulmonary/Chest: Effort normal and breath sounds normal. No respiratory distress. He has no wheezes. He has no rales.  Musculoskeletal: He exhibits no edema.  Neurological: He is alert and oriented to person, place, and time.  Skin: Skin is warm and dry.  Psychiatric: He has a normal  mood and affect. His behavior is normal. Thought content normal.          Assessment & Plan:

## 2016-12-28 ENCOUNTER — Encounter: Payer: Self-pay | Admitting: Family

## 2016-12-28 LAB — LIPID PANEL
Cholesterol: 143 mg/dL (ref 0–200)
HDL: 71 mg/dL (ref >39.00)
LDL Cholesterol: 50 mg/dL (ref 0–99)
NONHDL: 71.81
Total CHOL/HDL Ratio: 2
Triglycerides: 109 mg/dL (ref 0.0–149.0)
VLDL: 21.8 mg/dL (ref 0.0–40.0)

## 2016-12-28 LAB — COMPREHENSIVE METABOLIC PANEL
ALT: 27 U/L (ref 0–53)
AST: 30 U/L (ref 0–37)
Albumin: 4.4 g/dL (ref 3.5–5.2)
Alkaline Phosphatase: 64 U/L (ref 39–117)
BUN: 25 mg/dL — AB (ref 6–23)
CHLORIDE: 104 meq/L (ref 96–112)
CO2: 27 mEq/L (ref 19–32)
CREATININE: 0.84 mg/dL (ref 0.40–1.50)
Calcium: 9.6 mg/dL (ref 8.4–10.5)
GFR: 119.98 mL/min (ref >60.00)
GLUCOSE: 93 mg/dL (ref 70–99)
POTASSIUM: 4.3 meq/L (ref 3.5–5.1)
SODIUM: 140 meq/L (ref 135–145)
TOTAL PROTEIN: 7.3 g/dL (ref 6.0–8.3)
Total Bilirubin: 0.3 mg/dL (ref 0.2–1.2)

## 2016-12-28 LAB — HEMOGLOBIN A1C: HEMOGLOBIN A1C: 5.8 % (ref 4.6–6.5)

## 2017-04-25 ENCOUNTER — Telehealth: Payer: Self-pay | Admitting: Family

## 2017-04-25 ENCOUNTER — Ambulatory Visit: Payer: 59 | Admitting: Family

## 2017-04-25 NOTE — Telephone Encounter (Signed)
Called and left message appt for today 04/25/17 needs to be rescheduled due to pcp being sick.

## 2017-04-27 ENCOUNTER — Telehealth: Payer: Self-pay | Admitting: *Deleted

## 2017-04-27 DIAGNOSIS — E1169 Type 2 diabetes mellitus with other specified complication: Secondary | ICD-10-CM

## 2017-04-27 DIAGNOSIS — E785 Hyperlipidemia, unspecified: Principal | ICD-10-CM

## 2017-04-27 MED ORDER — LOSARTAN POTASSIUM 25 MG PO TABS: 25.0000 mg | ORAL_TABLET | Freq: Every day | ORAL | 1 refills | Status: DC

## 2017-04-27 MED ORDER — AMLODIPINE BESYLATE 10 MG PO TABS: 10.0000 mg | ORAL_TABLET | Freq: Every day | ORAL | 1 refills | Status: DC

## 2017-04-27 NOTE — Telephone Encounter (Signed)
[facility]mg  and losartan 25mg . Pt has OV on 05/02/17. Refills sent.

## 2017-04-28 ENCOUNTER — Other Ambulatory Visit: Payer: Self-pay | Admitting: *Deleted

## 2017-04-28 ENCOUNTER — Other Ambulatory Visit: Payer: Self-pay | Admitting: Family

## 2017-04-28 MED ORDER — FENOFIBRATE 145 MG PO TABS: 145.0000 mg | ORAL_TABLET | Freq: Every day | ORAL | 1 refills | Status: DC

## 2017-04-28 NOTE — Telephone Encounter (Signed)
Copied from [place] [id number]. Topic: Quick Communication - Rx Refill/Question >> Apr 28, 2017  4:06 PM Robina Ade, Helene Kelp D wrote: Medication:fenofibrate (TRICOR) 145 MG tablet   Has the patient contacted their pharmacy? Yes   (Agent: If no, request that the patient contact the pharmacy for the refill.)   Preferred Pharmacy (with phone number or street name): [id number] [place] [place]: Please be advised that RX refills may take up to 3 business days. We ask that you follow-up with your pharmacy.

## 2017-05-02 ENCOUNTER — Ambulatory Visit: Payer: 59 | Admitting: Family

## 2017-07-18 ENCOUNTER — Telehealth: Payer: Self-pay | Admitting: Family

## 2017-07-19 NOTE — Telephone Encounter (Signed)
Called pt and LVM regarding his refill request. Informed that a 30 day supply was sent in but he would need an office visit before any further refills can be sent.

## 2017-07-19 NOTE — Telephone Encounter (Signed)
30 day supply of Crestor sent to pt's pharmacy. Pt needs follow up with Melissa now.  Please call pt to schedule appt with Melissa soon. He will need to be seen before further refills are given. Thanks!

## 2017-07-26 ENCOUNTER — Encounter: Payer: Self-pay | Admitting: Gastroenterology

## 2017-09-05 ENCOUNTER — Encounter: Payer: Self-pay | Admitting: Family

## 2017-09-05 ENCOUNTER — Ambulatory Visit: Payer: 59 | Admitting: Family

## 2017-09-05 VITALS — BP 133/79 | HR 77 | Temp 98.5°F | Resp 16 | Ht 69.0 in | Wt 214.0 lb

## 2017-09-05 DIAGNOSIS — E118 Type 2 diabetes mellitus with unspecified complications: Secondary | ICD-10-CM | POA: Diagnosis not present

## 2017-09-05 DIAGNOSIS — E785 Hyperlipidemia, unspecified: Secondary | ICD-10-CM | POA: Diagnosis not present

## 2017-09-05 DIAGNOSIS — M545 Low back pain, unspecified: Secondary | ICD-10-CM

## 2017-09-05 DIAGNOSIS — I10 Essential (primary) hypertension: Secondary | ICD-10-CM

## 2017-09-05 MED ORDER — MELOXICAM 7.5 MG PO TABS: 7.5000 mg | ORAL_TABLET | Freq: Every day | ORAL | 0 refills | Status: DC

## 2017-09-05 NOTE — Patient Instructions (Signed)
Begin meloxicam for your back pain as well as the back exercises below. Call if pain worsens or if it is not improved in 2 weeks.  Complete lab work prior to leaving. Apply a corn cushion to the irritated area on your left toe.  Call if "blister" worsens or if it does not improve.    Back Exercises The following exercises strengthen the muscles that help to support the back. They also help to keep the lower back flexible. Doing these exercises can help to prevent back pain or lessen existing pain. If you have back pain or discomfort, try doing these exercises 2-3 times each day or as told by your health care provider. When the pain goes away, do them once each day, but increase the number of times that you repeat the steps for each exercise (do more repetitions). If you do not have back pain or discomfort, do these exercises once each day or as told by your health care provider. Exercises Single Knee to Chest  Repeat these steps 3-5 times for each leg: 1. Lie on your back on a firm bed or the floor with your legs extended. 2. Bring one knee to your chest. Your other leg should stay extended and in contact with the floor. 3. Hold your knee in place by grabbing your knee or thigh. 4. Pull on your knee until you feel a gentle stretch in your lower back. 5. Hold the stretch for 10-30 seconds. 6. Slowly release and straighten your leg.  Pelvic Tilt  Repeat these steps 5-10 times: 1. Lie on your back on a firm bed or the floor with your legs extended. 2. Bend your knees so they are pointing toward the ceiling and your feet are flat on the floor. 3. Tighten your lower abdominal muscles to press your lower back against the floor. This motion will tilt your pelvis so your tailbone points up toward the ceiling instead of pointing to your feet or the floor. 4. With gentle tension and even breathing, hold this position for 5-10 seconds.  Cat-Cow  Repeat these steps until your lower back becomes  more flexible: 1. Get into a hands-and-knees position on a firm surface. Keep your hands under your shoulders, and keep your knees under your hips. You may place padding under your knees for comfort. 2. Let your head hang down, and point your tailbone toward the floor so your lower back becomes rounded like the back of a cat. 3. Hold this position for 5 seconds. 4. Slowly lift your head and point your tailbone up toward the ceiling so your back forms a sagging arch like the back of a cow. 5. Hold this position for 5 seconds.  Press-Ups  Repeat these steps 5-10 times: 1. Lie on your abdomen (face-down) on the floor. 2. Place your palms near your head, about shoulder-width apart. 3. While you keep your back as relaxed as possible and keep your hips on the floor, slowly straighten your arms to raise the top half of your body and lift your shoulders. Do not use your back muscles to raise your upper torso. You may adjust the placement of your hands to make yourself more comfortable. 4. Hold this position for 5 seconds while you keep your back relaxed. 5. Slowly return to lying flat on the floor.  Bridges  Repeat these steps 10 times: 1. Lie on your back on a firm surface. 2. Bend your knees so they are pointing toward the ceiling and your feet  are flat on the floor. 3. Tighten your buttocks muscles and lift your buttocks off of the floor until your waist is at almost the same height as your knees. You should feel the muscles working in your buttocks and the back of your thighs. If you do not feel these muscles, slide your feet 1-2 inches farther away from your buttocks. 4. Hold this position for 3-5 seconds. 5. Slowly lower your hips to the starting position, and allow your buttocks muscles to relax completely.  If this exercise is too easy, try doing it with your arms crossed over your chest. Abdominal Crunches  Repeat these steps 5-10 times: 1. Lie on your back on a firm bed or the floor  with your legs extended. 2. Bend your knees so they are pointing toward the ceiling and your feet are flat on the floor. 3. Cross your arms over your chest. 4. Tip your chin slightly toward your chest without bending your neck. 5. Tighten your abdominal muscles and slowly raise your trunk (torso) high enough to lift your shoulder blades a tiny bit off of the floor. Avoid raising your torso higher than that, because it can put too much stress on your low back and it does not help to strengthen your abdominal muscles. 6. Slowly return to your starting position.  Back Lifts Repeat these steps 5-10 times: 1. Lie on your abdomen (face-down) with your arms at your sides, and rest your forehead on the floor. 2. Tighten the muscles in your legs and your buttocks. 3. Slowly lift your chest off of the floor while you keep your hips pressed to the floor. Keep the back of your head in line with the curve in your back. Your eyes should be looking at the floor. 4. Hold this position for 3-5 seconds. 5. Slowly return to your starting position.  Contact a health care provider if:  Your back pain or discomfort gets much worse when you do an exercise.  Your back pain or discomfort does not lessen within 2 hours after you exercise. If you have any of these problems, stop doing these exercises right away. Do not do them again unless your health care provider says that you can. Get help right away if:  You develop sudden, severe back pain. If this happens, stop doing the exercises right away. Do not do them again unless your health care provider says that you can. This information is not intended to replace advice given to you by your health care provider. Make sure you discuss any questions you have with your health care provider. Document Released: 04/01/2004 Document Revised: 07/02/2015 Document Reviewed: 04/18/2014 Elsevier Interactive Patient Education  2017 [employer].

## 2017-09-05 NOTE — Progress Notes (Signed)
Subjective:    Patient ID: Drew Harris, male    DOB: 04-27-1955, 62 y.o.   MRN: [id number]  HPI  DM2-  Lab Results  Component Value Date   HGBA1C 5.8 12/27/2016   HGBA1C 5.9 08/30/2016   HGBA1C 5.9 05/03/2016   Lab Results  Component Value Date   MICROALBUR 4.8 (H) 06/11/2014   LDLCALC 50 12/27/2016   CREATININE 0.84 12/27/2016   Hyperlipidemia- maintained on crestor and tricor.  Lab Results  Component Value Date   CHOL 143 12/27/2016   HDL 71.00 12/27/2016   LDLCALC 50 12/27/2016   LDLDIRECT 40.0 06/02/2015   TRIG 109.0 12/27/2016   CHOLHDL 2 12/27/2016   HTN- maintained on amlodipine 10mg  and losartan 25mg . Denies CP/SOB or swelling.   BP Readings from Last 3 Encounters:  09/05/17 133/79  12/27/16 138/85  09/06/16 131/83   Wt Readings from Last 3 Encounters:  09/05/17 214 lb (97.1 kg)  12/27/16 221 lb 6.4 oz (100.4 kg)  09/06/16 213 lb (96.6 kg)   Has some low back pain on the left has been present for a few week.   Review of Systems See HPI  Past Medical History:  Diagnosis Date  . Allergic rhinitis   . Borderline diabetes    Type 2  . Diabetes mellitus without complication ([place])   . ED (erectile dysfunction)   . Hyperlipidemia   . Hypertension      Social History   Socioeconomic History  . Marital status: Single    Spouse name: Not on file  . Number of children: Not on file  . Years of education: Not on file  . Highest education level: Not on file  Occupational History  . Not on file  Social Needs  . Financial resource strain: Not on file  . Food insecurity:    Worry: Not on file    Inability: Not on file  . Transportation needs:    Medical: Not on file    Non-medical: Not on file  Tobacco Use  . Smoking status: Current Some Day Smoker  . Smokeless tobacco: Never Used  . Tobacco comment: Pt somkes occasionally  Substance and Sexual Activity  . Alcohol use: Yes  . Drug use: No  . Sexual activity: Not on file  Lifestyle  .  Physical activity:    Days per week: Not on file    Minutes per session: Not on file  . Stress: Not on file  Relationships  . Social connections:    Talks on phone: Not on file    Gets together: Not on file    Attends religious service: Not on file    Active member of club or organization: Not on file    Attends meetings of clubs or organizations: Not on file    Relationship status: Not on file  . Intimate partner violence:    Fear of current or ex partner: Not on file    Emotionally abused: Not on file    Physically abused: Not on file    Forced sexual activity: Not on file  Other Topics Concern  . 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 APPENDECTOMY      Family History  Problem Relation Age of Onset  . Prostate cancer Father        deceased age 32 secondary to prostate  cancer  . Hypertension Mother   . Diabetes type II Sister     Allergies  Allergen Reactions  . Ace Inhibitors     ?cough  . Atorvastatin Nausea Only    "Dark urine"    Current Outpatient Medications on File Prior to Visit  Medication Sig Dispense Refill  . amLODipine (NORVASC) 10 MG tablet Take 1 tablet (10 mg total) by mouth daily. 90 tablet 1  . aspirin EC 81 MG tablet Take 81 mg by mouth daily.    . fenofibrate (TRICOR) 145 MG tablet Take 1 tablet (145 mg total) by mouth daily. 30 tablet 1  . losartan (COZAAR) 25 MG tablet Take 1 tablet (25 mg total) by mouth daily. 90 tablet 1  . meloxicam (MOBIC) 7.5 MG tablet Take 1 tablet (7.5 mg total) by mouth daily. 14 tablet 0  . Omega-3 Fatty Acids (FISH OIL) 1000 MG CAPS Take 2,000 mg by mouth 2 (two) times daily. Reported on 05/05/2015    . rosuvastatin (CRESTOR) 20 MG tablet TAKE 1 TABLET BY MOUTH EVERY DAY 30 tablet 0  . sildenafil (VIAGRA) 50 MG tablet Take 0.5 tablets (25 mg total) by mouth daily as needed for erectile dysfunction.  10 tablet 5   No current facility-administered medications on file prior to visit.     BP 133/79 (BP Location: Right Arm, Patient Position: Sitting, Cuff Size: Large)   Pulse 77   Temp 98.5 F (36.9 C) (Oral)   Resp 16   Ht 5\' 9"  (1.753 m)   Wt 214 lb (97.1 kg)   SpO2 100%   BMI 31.60 kg/m       Objective:   Physical Exam  Constitutional: He is oriented to person, place, and time. He appears well-developed and well-nourished. No distress.  HENT:  Head: Normocephalic and atraumatic.  Cardiovascular: Normal rate and regular rhythm.  No murmur heard. Pulmonary/Chest: Effort normal and breath sounds normal. No respiratory distress. He has no wheezes. He has no rales.  Musculoskeletal: He exhibits no edema.  Neurological: He is alert and oriented to person, place, and time.  Skin: Skin is warm and dry.  Psychiatric: He has a normal mood and affect. His behavior is normal. Thought content normal.          Assessment & Plan:  Low back pain- rx with meloxicam Advised pt on back exercises as outlined in AVS.  DM2- clinically stable, obtain follow up A1C.  HTN- bp stable on current meds, continue same, obtain follow up bmet.  Hyperlipidemia- lipids stable, continue statin.

## 2017-09-06 ENCOUNTER — Encounter: Payer: Self-pay | Admitting: Family

## 2017-09-06 LAB — BASIC METABOLIC PANEL
BUN: 21 mg/dL (ref 6–23)
CALCIUM: 9.1 mg/dL (ref 8.4–10.5)
CHLORIDE: 104 meq/L (ref 96–112)
CO2: 23 mEq/L (ref 19–32)
CREATININE: 0.82 mg/dL (ref 0.40–1.50)
GFR: 123.07 mL/min (ref >60.00)
Glucose, Bld: 98 mg/dL (ref 70–99)
Potassium: 3.7 mEq/L (ref 3.5–5.1)
Sodium: 141 mEq/L (ref 135–145)

## 2017-09-06 LAB — HEMOGLOBIN A1C: HEMOGLOBIN A1C: 5.7 % (ref 4.6–6.5)

## 2017-09-09 ENCOUNTER — Other Ambulatory Visit: Payer: Self-pay | Admitting: Family

## 2017-09-09 DIAGNOSIS — E785 Hyperlipidemia, unspecified: Principal | ICD-10-CM

## 2017-09-09 DIAGNOSIS — E1169 Type 2 diabetes mellitus with other specified complication: Secondary | ICD-10-CM

## 2017-10-01 ENCOUNTER — Other Ambulatory Visit: Payer: Self-pay | Admitting: Family

## 2017-12-12 ENCOUNTER — Ambulatory Visit: Payer: 59 | Admitting: Family

## 2017-12-27 ENCOUNTER — Encounter: Payer: Self-pay | Admitting: Family

## 2017-12-27 ENCOUNTER — Ambulatory Visit: Payer: 59 | Admitting: Family

## 2017-12-27 VITALS — BP 140/92 | HR 94 | Temp 98.3°F | Resp 16 | Ht 69.0 in | Wt 215.0 lb

## 2017-12-27 DIAGNOSIS — K529 Noninfective gastroenteritis and colitis, unspecified: Secondary | ICD-10-CM | POA: Diagnosis not present

## 2017-12-27 LAB — COMPREHENSIVE METABOLIC PANEL
ALT: 35 U/L (ref 0–53)
AST: 46 U/L — AB (ref 0–37)
Albumin: 4.7 g/dL (ref 3.5–5.2)
Alkaline Phosphatase: 59 U/L (ref 39–117)
BUN: 20 mg/dL (ref 6–23)
CO2: 24 mEq/L (ref 19–32)
Calcium: 9.3 mg/dL (ref 8.4–10.5)
Chloride: 98 mEq/L (ref 96–112)
Creatinine, Ser: 0.87 mg/dL (ref 0.40–1.50)
GFR: 114.83 mL/min (ref >60.00)
GLUCOSE: 98 mg/dL (ref 70–99)
POTASSIUM: 4.1 meq/L (ref 3.5–5.1)
SODIUM: 135 meq/L (ref 135–145)
TOTAL PROTEIN: 7.5 g/dL (ref 6.0–8.3)
Total Bilirubin: 0.8 mg/dL (ref 0.2–1.2)

## 2017-12-27 LAB — CBC WITH DIFFERENTIAL/PLATELET
Basophils Absolute: 0 10*3/uL (ref 0.0–0.1)
Basophils Relative: 1.3 % (ref 0.0–3.0)
EOS ABS: 0.1 10*3/uL (ref 0.0–0.7)
Eosinophils Relative: 3.6 % (ref 0.0–5.0)
HEMATOCRIT: 42 % (ref 39.0–52.0)
Hemoglobin: 14.2 g/dL (ref 13.0–17.0)
LYMPHS ABS: 1.8 10*3/uL (ref 0.7–4.0)
Lymphocytes Relative: 48.8 % — ABNORMAL HIGH (ref 12.0–46.0)
MCHC: 33.9 g/dL (ref 30.0–36.0)
MCV: 103.9 fl — AB (ref 78.0–100.0)
MONO ABS: 0.4 10*3/uL (ref 0.1–1.0)
Monocytes Relative: 11.1 % (ref 3.0–12.0)
NEUTROS ABS: 1.3 10*3/uL — AB (ref 1.4–7.7)
Neutrophils Relative %: 35.2 % — ABNORMAL LOW (ref 43.0–77.0)
Platelets: 216 10*3/uL (ref 150.0–400.0)
RBC: 4.05 Mil/uL — ABNORMAL LOW (ref 4.22–5.81)
RDW: 14 % (ref 11.5–15.5)
WBC: 3.6 10*3/uL — ABNORMAL LOW (ref 4.0–10.5)

## 2017-12-27 MED ORDER — ONDANSETRON HCL 4 MG PO TABS: 4.0000 mg | ORAL_TABLET | Freq: Three times a day (TID) | ORAL | 0 refills | Status: DC | PRN

## 2017-12-27 NOTE — Patient Instructions (Signed)
You may use zofran as needed for nausea. Call if you develop abdominal pain, inability to keep down liquids, fever, or if symptoms are not improved in 3-4 days.   Viral Gastroenteritis, Adult Viral gastroenteritis is also known as the stomach flu. This condition is caused by certain germs (viruses). These germs can be passed from person to person very easily (are very contagious). This condition can cause sudden watery poop (diarrhea), fever, and throwing up (vomiting). Having watery poop and throwing up can make you feel weak and cause you to get dehydrated. Dehydration can make you tired and thirsty, make you have a dry mouth, and make it so you pee (urinate) less often. Older adults and people with other diseases or a weak defense system (immune system) are at higher risk for dehydration. It is important to replace the fluids that you lose from having watery poop and throwing up. Follow these instructions at home: Follow instructions from your doctor about how to care for yourself at home. Eating and drinking  Follow these instructions as told by your doctor:  Take an oral rehydration solution (ORS). This is a drink that is sold at pharmacies and stores.  Drink clear fluids in small amounts as you are able, such as: ? Water. ? Ice chips. ? Diluted fruit juice. ? Low-calorie sports drinks.  Eat bland, easy-to-digest foods in small amounts as you are able, such as: ? Bananas. ? Applesauce. ? Rice. ? Low-fat (lean) meats. ? Toast. ? Crackers.  Avoid fluids that have a lot of sugar or caffeine in them.  Avoid alcohol.  Avoid spicy or fatty foods.  General instructions  Drink enough fluid to keep your pee (urine) clear or pale yellow.  Wash your hands often. If you cannot use soap and water, use hand sanitizer.  Make sure that all people in your home wash their hands well and often.  Rest at home while you get better.  Take over-the-counter and prescription medicines only  as told by your doctor.  Watch your condition for any changes.  Take a warm bath to help with any burning or pain from having watery poop.  Keep all follow-up visits as told by your doctor. This is important. Contact a doctor if:  You cannot keep fluids down.  Your symptoms get worse.  You have new symptoms.  You feel light-headed or dizzy.  You have muscle cramps. Get help right away if:  You have chest pain.  You feel very weak or you pass out (faint).  You see blood in your throw-up.  Your throw-up looks like coffee grounds.  You have bloody or black poop (stools) or poop that look like tar.  You have a very bad headache, a stiff neck, or both.  You have a rash.  You have very bad pain, cramping, or bloating in your belly (abdomen).  You have trouble breathing.  You are breathing very quickly.  Your heart is beating very quickly.  Your skin feels cold and clammy.  You feel confused.  You have pain when you pee.  You have signs of dehydration, such as: ? Dark pee, hardly any pee, or no pee. ? Cracked lips. ? Dry mouth. ? Sunken eyes. ? Sleepiness. ? Weakness. This information is not intended to replace advice given to you by your health care provider. Make sure you discuss any questions you have with your health care provider. Document Released: 08/11/2007 Document Revised: 09/12/2015 Document Reviewed: 10/29/2014 Elsevier Interactive Patient Education  2017  [employer].

## 2017-12-27 NOTE — Progress Notes (Signed)
Subjective:    Patient ID: Drew Harris, male    DOB: 1955/10/07, 62 y.o.   MRN: [id number]  HPI  Patient is a 62 yr old male who presents today with chief complaint of nausea.  Reports that symptoms began on Sunday 10/20 and has been associated with vomitting X 2. Reports that he took yesterday off hoping he would feel a little better but he didn't so he called to be seen today. Denies associated fever, abdominal pain. Reports occasional urinary frequency but he attributes to drinking a lot of water.  Denies dysuria, hematuria.  Reports normal BM this AM. Denies known sick contacts with similar symptoms. Reports that he is drinking a good amount of water but has only been eating a small amount.       Review of Systems See HPI  Past Medical History:  Diagnosis Date  . Allergic rhinitis   . Borderline diabetes    Type 2  . Diabetes mellitus without complication ([place])   . ED (erectile dysfunction)   . Hyperlipidemia   . Hypertension      Social History   Socioeconomic History  . Marital status: Single    Spouse name: Not on file  . Number of children: Not on file  . Years of education: Not on file  . Highest education level: Not on file  Occupational History  . Not on file  Social Needs  . Financial resource strain: Not on file  . Food insecurity:    Worry: Not on file    Inability: Not on file  . Transportation needs:    Medical: Not on file    Non-medical: Not on file  Tobacco Use  . Smoking status: Current Some Day Smoker  . Smokeless tobacco: Never Used  . Tobacco comment: Pt somkes occasionally  Substance and Sexual Activity  . Alcohol use: Yes  . Drug use: No  . Sexual activity: Not on file  Lifestyle  . Physical activity:    Days per week: Not on file    Minutes per session: Not on file  . Stress: Not on file  Relationships  . Social connections:    Talks on phone: Not on file    Gets together: Not on file    Attends religious service: Not on  file    Active member of club or organization: Not on file    Attends meetings of clubs or organizations: Not on file    Relationship status: Not on file  . Intimate partner violence:    Fear of current or ex partner: Not on file    Emotionally abused: Not on file    Physically abused: Not on file    Forced sexual activity: Not on file  Other Topics Concern  . 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 APPENDECTOMY      Family History  Problem Relation Age of Onset  . Prostate cancer Father        deceased age 58 secondary to prostate cancer  . Hypertension Mother   . Diabetes type II Sister     Allergies  Allergen Reactions  . Ace Inhibitors     ?cough  . Atorvastatin Nausea Only    "Dark urine"    Current Outpatient Medications on File Prior to Visit  Medication Sig Dispense Refill  .  amLODipine (NORVASC) 10 MG tablet TAKE 1 TABLET BY MOUTH EVERY DAY 90 tablet 0  . aspirin EC 81 MG tablet Take 81 mg by mouth daily.    . fenofibrate (TRICOR) 145 MG tablet TAKE 1 TABLET BY MOUTH EVERY DAY 90 tablet 1  . losartan (COZAAR) 25 MG tablet TAKE 1 TABLET BY MOUTH EVERY DAY 90 tablet 0  . meloxicam (MOBIC) 7.5 MG tablet Take 1 tablet (7.5 mg total) by mouth daily. 14 tablet 0  . Omega-3 Fatty Acids (FISH OIL) 1000 MG CAPS Take 2,000 mg by mouth 2 (two) times daily. Reported on 05/05/2015    . rosuvastatin (CRESTOR) 20 MG tablet TAKE 1 TABLET BY MOUTH EVERY DAY 30 tablet 0  . sildenafil (VIAGRA) 50 MG tablet Take 0.5 tablets (25 mg total) by mouth daily as needed for erectile dysfunction. 10 tablet 5   No current facility-administered medications on file prior to visit.     BP (!) 140/92 (BP Location: Right Arm, Patient Position: Sitting, Cuff Size: Large)   Pulse 94   Temp 98.3 F (36.8 C) (Oral)   Resp 16   Ht 5\' 9"  (1.753 m)   Wt 215 lb (97.5 kg)   SpO2  98%   BMI 31.75 kg/m       Objective:   Physical Exam  Constitutional: He is oriented to person, place, and time. He appears well-developed and well-nourished. No distress.  HENT:  Head: Normocephalic and atraumatic.  Neck: Neck supple. No thyromegaly present.  Cardiovascular: Normal rate and regular rhythm.  No murmur heard. Pulmonary/Chest: Effort normal and breath sounds normal. No respiratory distress. He has no wheezes. He has no rales.  Abdominal: Soft. Bowel sounds are normal. He exhibits no distension. There is no tenderness.  Musculoskeletal: He exhibits no edema.  Neurological: He is alert and oriented to person, place, and time.  Skin: Skin is warm and dry.  Psychiatric: He has a normal mood and affect. His behavior is normal. Thought content normal.          Assessment & Plan:  Viral gastroenteritis- symptoms most consistent with viral gastroenteritis.  Pt was advised on supportive measures including zofran prn. He is advised to call if he develops abdominal pain, inability to keep down liquids, fever, or if symptoms are not improved in 3-4 days. Patient verbalizes understanding.

## 2018-01-13 ENCOUNTER — Telehealth: Payer: Self-pay | Admitting: *Deleted

## 2018-01-13 NOTE — Telephone Encounter (Signed)
Received request for 12/27/17 OV notes for FMLA/STD disability from [employer] [place]; print and fax to requested number/SLS 11/08

## 2018-01-19 ENCOUNTER — Telehealth: Payer: Self-pay | Admitting: Family

## 2018-01-19 NOTE — Telephone Encounter (Signed)
Pt's wife came by and dropped off ROI documentation, from [employer]. I made a copy and gave it to her and will fax the document to Medical Records.

## 2018-01-31 ENCOUNTER — Telehealth: Payer: Self-pay | Admitting: Family

## 2018-01-31 NOTE — Telephone Encounter (Signed)
Copied from [place] [phone]. Topic: Quick Communication - See Telephone Encounter >> Jan 31, 2018 11:42 AM Rosalin Hawking wrote: CRM for notification. See Telephone encounter for: 01/31/18.   Pt's family member dropped off document to be filled out by provider ([employer] Duty Certification - 1 page) Pt would like to be called at [phone] when ready to pick up. Document put at front office tray under providers name.

## 2018-02-07 NOTE — Telephone Encounter (Signed)
Can you please contact pt and ask the following  1. dates he was out of work  2. has he returned to work and if so when?  3.   Also need to know if the reason he was out of work was his stomach illness that I saw him for back in the end of October.    4.  Is he feeling better?

## 2018-02-07 NOTE — Telephone Encounter (Signed)
Completed as much as possible; forwarded to provider/SLS 12/03

## 2018-02-09 NOTE — Telephone Encounter (Signed)
Left message for pt to return my call with below information. [place] for [facility] / Triage to discuss with pt.

## 2018-02-10 NOTE — Telephone Encounter (Signed)
Spoke with pt. Form is for illness that was addressed on 12/27/17.  He was out of work 12/26/17 through 01/01/18. He returned to work on 01/02/18. He is feeling better.

## 2018-02-15 NOTE — Telephone Encounter (Signed)
Form filled and placed in sharon's green folder.

## 2018-04-02 ENCOUNTER — Other Ambulatory Visit: Payer: Self-pay | Admitting: Family

## 2019-02-07 ENCOUNTER — Telehealth: Payer: Self-pay

## 2019-02-07 NOTE — Telephone Encounter (Signed)
Copied from [name] [phone]. Topic: General - Other >> Feb 07, 2019  4:26 PM Drew Harris wrote: Reason for CRM:   Pt states that he needs to speak with [name].  States that she is the only one who knows what medications he takes and he needs refills.

## 2019-02-08 NOTE — Telephone Encounter (Signed)
Lvm for patient to call me back at office, he has not been seen in over 1 year and needs appointment. Also need name of medication he is needing. Will let patient know Thish is now working in the lab but we will be glad to help him.

## 2019-02-08 NOTE — Telephone Encounter (Signed)
Patient is returning Rod Holler call. Patient still would like to speak with her Call back [phone]

## 2019-02-09 NOTE — Telephone Encounter (Signed)
Patient has been scheduled for follow up on 02-16-19

## 2019-02-09 NOTE — Telephone Encounter (Signed)
lvm for patient to call back again

## 2019-02-16 ENCOUNTER — Ambulatory Visit (INDEPENDENT_AMBULATORY_CARE_PROVIDER_SITE_OTHER): Payer: 59 | Admitting: Family

## 2019-02-16 ENCOUNTER — Telehealth: Payer: Self-pay | Admitting: Family

## 2019-02-16 ENCOUNTER — Other Ambulatory Visit: Payer: Self-pay

## 2019-02-16 DIAGNOSIS — E118 Type 2 diabetes mellitus with unspecified complications: Secondary | ICD-10-CM

## 2019-02-16 DIAGNOSIS — I10 Essential (primary) hypertension: Secondary | ICD-10-CM | POA: Diagnosis not present

## 2019-02-16 DIAGNOSIS — E1169 Type 2 diabetes mellitus with other specified complication: Secondary | ICD-10-CM

## 2019-02-16 DIAGNOSIS — F528 Other sexual dysfunction not due to a substance or known physiological condition: Secondary | ICD-10-CM

## 2019-02-16 DIAGNOSIS — E785 Hyperlipidemia, unspecified: Secondary | ICD-10-CM

## 2019-02-16 MED ORDER — FENOFIBRATE 145 MG PO TABS: 145.0000 mg | ORAL_TABLET | Freq: Every day | ORAL | 1 refills | Status: DC

## 2019-02-16 MED ORDER — ROSUVASTATIN CALCIUM 20 MG PO TABS: 20.0000 mg | ORAL_TABLET | Freq: Every day | ORAL | 1 refills | Status: DC

## 2019-02-16 MED ORDER — MELOXICAM 7.5 MG PO TABS: 7.5000 mg | ORAL_TABLET | Freq: Every day | ORAL | 0 refills | Status: DC | PRN

## 2019-02-16 MED ORDER — AMLODIPINE BESYLATE 10 MG PO TABS: 10.0000 mg | ORAL_TABLET | Freq: Every day | ORAL | 1 refills | Status: DC

## 2019-02-16 MED ORDER — SILDENAFIL CITRATE 20 MG PO TABS: ORAL_TABLET | ORAL | 1 refills | Status: DC

## 2019-02-16 MED ORDER — LOSARTAN POTASSIUM 25 MG PO TABS: 25.0000 mg | ORAL_TABLET | Freq: Every day | ORAL | 1 refills | Status: DC

## 2019-02-16 NOTE — Telephone Encounter (Signed)
Please contact pt to schedule a nurse visit for blood pressure check, flu shot and lab work.

## 2019-02-16 NOTE — Progress Notes (Signed)
Virtual Visit via Video Note  I connected with Drew Harris on 02/16/19 at  4:20 PM EST by a video enabled telemedicine application and verified that I am speaking with the correct person using two identifiers.  Location: Patient: home Provider: work   I discussed the limitations of evaluation and management by telemedicine and the availability of in person appointments. The patient expressed understanding and agreed to proceed.  History of Present Illness:   HTN- BP medications include amlodipine 10mg , losartan 25mg .    Patient does not have a blood pressure cuff.   BP Readings from Last 3 Encounters:  12/27/17 (!) 140/92  09/05/17 133/79  12/27/16 138/85   DM2- diet controlled.  Lab Results  Component Value Date   HGBA1C 5.7 09/05/2017   Hyperlipidemia- maintained on crestor and tricor Lab Results  Component Value Date   CHOL 143 12/27/2016   HDL 71.00 12/27/2016   LDLCALC 50 12/27/2016   LDLDIRECT 40.0 06/02/2015   TRIG 109.0 12/27/2016   CHOLHDL 2 12/27/2016   ED- could not afford viagra, interested in the generic.    Past Medical History:  Diagnosis Date  . Allergic rhinitis   . Borderline diabetes    Type 2  . Diabetes mellitus without complication ([place])   . ED (erectile dysfunction)   . Hyperlipidemia   . Hypertension      Social History   Socioeconomic History  . Marital status: Single    Spouse name: Not on file  . Number of children: Not on file  . Years of education: Not on file  . Highest education level: Not on file  Occupational History  . Not on file  Tobacco Use  . Smoking status: Current Some Day Smoker  . Smokeless tobacco: Never Used  . Tobacco comment: Pt somkes occasionally  Substance and Sexual Activity  . Alcohol use: Yes  . Drug use: No  . Sexual activity: Not on file  Other Topics Concern  . 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 Difficulty of Paying Living Expenses: Not on file  Food Insecurity:   . Worried About Charity fundraiser in the Last Year: Not on file  . Ran Out of Food in the Last Year: Not on file  Transportation Needs:   . Lack of Transportation (Medical): Not on file  . Lack of Transportation (Non-Medical): Not on file  Physical Activity:   . Days of Exercise per Week: Not on file  . Minutes of Exercise per Session: Not on file  Stress:   . Feeling of Stress : Not on file  Social Connections:   . Frequency of Communication with Friends and Family: Not on file  . Frequency of Social Gatherings with Friends and Family: Not on file  . Attends Religious Services: Not on file  . Active Member of Clubs or Organizations: Not on file  . Attends Archivist Meetings: Not on file  . Marital Status: Not on file  Intimate Partner Violence:   . Fear of Current or Ex-Partner: Not on file  . Emotionally Abused: Not on file  . Physically Abused: Not on file  . Sexually Abused: Not on file    Past Surgical History:  Procedure Laterality Date  . APPENDECTOMY      Family History  Problem Relation Age of  Onset  . Prostate cancer Father        deceased age 68 secondary to prostate cancer  . Hypertension Mother   . Diabetes type II Sister     Allergies  Allergen Reactions  . Ace Inhibitors     ?cough  . Atorvastatin Nausea Only    "Dark urine"    Current Outpatient Medications on File Prior to Visit  Medication Sig Dispense Refill  . aspirin EC 81 MG tablet Take 81 mg by mouth daily.    . Omega-3 Fatty Acids (FISH OIL) 1000 MG CAPS Take 2,000 mg by mouth 2 (two) times daily. Reported on 05/05/2015    . ondansetron (ZOFRAN) 4 MG tablet Take 1 tablet (4 mg total) by mouth every 8 (eight) hours as needed for nausea or vomiting. 20 tablet 0   No current facility-administered medications on file prior to visit.    There were no vitals taken for  this visit.     Observations/Objective:   Gen: Awake, alert, no acute distress Resp: Breathing is even and non-labored Psych: calm/pleasant demeanor Neuro: Alert and Oriented x 3, + facial symmetry, speech is clear.   Assessment and Plan:  HTN- will plan a nurse visit for bp check and lab work.  BP Readings from Last 3 Encounters:  12/27/17 (!) 140/92  09/05/17 133/79  12/27/16 138/85   Hyperlipidemia- obtain follow up lipid panel.  ED- trial of generic sildenafil.   DM2- diet controlled. Obtain follow up A1C.   Follow Up Instructions:    I discussed the assessment and treatment plan with the patient. The patient was provided an opportunity to ask questions and all were answered. The patient agreed with the plan and demonstrated an understanding of the instructions.   The patient was advised to call back or seek an in-person evaluation if the symptoms worsen or if the condition fails to improve as anticipated.  Nance Pear, NP

## 2019-02-19 ENCOUNTER — Encounter: Payer: Self-pay | Admitting: Family

## 2019-02-19 NOTE — Telephone Encounter (Signed)
Called lvm to Return for 1 week for nurse visit blood pressure check and lab work. 4 months for in person office visit.Marland Kitchen

## 2019-02-21 ENCOUNTER — Other Ambulatory Visit: Payer: 59

## 2019-02-22 NOTE — Telephone Encounter (Signed)
Looks like [facility] scheduled him a lab appointment but not a nurse visit for BP check- can you please check?

## 2019-03-07 ENCOUNTER — Ambulatory Visit (INDEPENDENT_AMBULATORY_CARE_PROVIDER_SITE_OTHER): Payer: Managed Care, Other (non HMO)

## 2019-03-07 ENCOUNTER — Other Ambulatory Visit (INDEPENDENT_AMBULATORY_CARE_PROVIDER_SITE_OTHER): Payer: Managed Care, Other (non HMO)

## 2019-03-07 ENCOUNTER — Other Ambulatory Visit: Payer: Self-pay

## 2019-03-07 VITALS — BP 142/86

## 2019-03-07 DIAGNOSIS — E785 Hyperlipidemia, unspecified: Secondary | ICD-10-CM | POA: Diagnosis not present

## 2019-03-07 DIAGNOSIS — E1169 Type 2 diabetes mellitus with other specified complication: Secondary | ICD-10-CM | POA: Diagnosis not present

## 2019-03-07 DIAGNOSIS — E118 Type 2 diabetes mellitus with unspecified complications: Secondary | ICD-10-CM

## 2019-03-07 DIAGNOSIS — Z23 Encounter for immunization: Secondary | ICD-10-CM | POA: Diagnosis not present

## 2019-03-07 LAB — LIPID PANEL
Cholesterol: 255 mg/dL — ABNORMAL HIGH (ref 0–200)
HDL: 102.7 mg/dL (ref >39.00)
LDL Cholesterol: 113 mg/dL — ABNORMAL HIGH (ref 0–99)
NonHDL: 152.62
Total CHOL/HDL Ratio: 2
Triglycerides: 200 mg/dL — ABNORMAL HIGH (ref 0.0–149.0)
VLDL: 40 mg/dL (ref 0.0–40.0)

## 2019-03-07 LAB — COMPREHENSIVE METABOLIC PANEL
ALT: 54 U/L — ABNORMAL HIGH (ref 0–53)
AST: 130 U/L — ABNORMAL HIGH (ref 0–37)
Albumin: 4.5 g/dL (ref 3.5–5.2)
Alkaline Phosphatase: 65 U/L (ref 39–117)
BUN: 13 mg/dL (ref 6–23)
CO2: 24 mEq/L (ref 19–32)
Calcium: 9.1 mg/dL (ref 8.4–10.5)
Chloride: 96 mEq/L (ref 96–112)
Creatinine, Ser: 0.69 mg/dL (ref 0.40–1.50)
GFR: 140.62 mL/min (ref >60.00)
Glucose, Bld: 125 mg/dL — ABNORMAL HIGH (ref 70–99)
Potassium: 3.3 mEq/L — ABNORMAL LOW (ref 3.5–5.1)
Sodium: 136 mEq/L (ref 135–145)
Total Bilirubin: 1.8 mg/dL — ABNORMAL HIGH (ref 0.2–1.2)
Total Protein: 7.4 g/dL (ref 6.0–8.3)

## 2019-03-07 LAB — HEMOGLOBIN A1C: Hgb A1c MFr Bld: 5.6 % (ref 4.6–6.5)

## 2019-03-07 NOTE — Progress Notes (Addendum)
Pt here for Blood pressure check per  Melissa  Pt currently takes: amlodipine 10mg  daily and Losartan 25mg  daily.   Patient came in today for fasting labs and has not taken his medication this am. He was advised to make another appointment for Nurse visit BP check after a time he has taking his medication. He agreed and voiced his understanding.  Patient also had gotten his flu shot and was extremely nervous.    Pt reports compliance with medication.  BP today @ = 169/109 HR = 102   Waited 10 minutes recheck 142/82, allowed patient to relax some then rechecked. He had to leave and was told he would be called and advised if there was a change of his medication   Addendum:  Will increase losartan from 25mg  to 50mg  once daily. Follow up in 1 month.

## 2019-03-08 ENCOUNTER — Telehealth: Payer: Self-pay | Admitting: Family

## 2019-03-08 DIAGNOSIS — R945 Abnormal results of liver function studies: Secondary | ICD-10-CM

## 2019-03-08 DIAGNOSIS — R7989 Other specified abnormal findings of blood chemistry: Secondary | ICD-10-CM

## 2019-03-08 MED ORDER — LOSARTAN POTASSIUM 50 MG PO TABS: 50.0000 mg | ORAL_TABLET | Freq: Every day | ORAL | 0 refills | Status: DC

## 2019-03-08 NOTE — Telephone Encounter (Signed)
Called patient left message for patient to call the office back  

## 2019-03-08 NOTE — Telephone Encounter (Signed)
Spoke with patient he stated he does drink a beer here and there, he stated he will call next week to schedule an appointment. He stated he will make sure he is taking his Crestor and also come by to have the ultrasound completed. He asked if we can call him next week to remind him of the ultrasound as he is going to work out of town for a few days and may not remember.

## 2019-03-08 NOTE — Telephone Encounter (Signed)
Reviewed nurse visit blood pressure check.  I would like him to increase his losartan from 25mg  to 50mg .   Follow up with Kyrin Gratz in 1 month face to face.

## 2019-03-08 NOTE — Telephone Encounter (Signed)
Also, please advise pt that his liver function testing is abnormal.  Does he drink alcohol?  If so please discontinue.  I would like to for him complete an abdominal ultrasound (pended).   Cholesterol is up. Has he been missing doses of crestor?

## 2019-03-12 NOTE — Telephone Encounter (Signed)
Noted, schedulers will call him.

## 2019-04-05 ENCOUNTER — Ambulatory Visit (HOSPITAL_BASED_OUTPATIENT_CLINIC_OR_DEPARTMENT_OTHER): Payer: Self-pay

## 2019-04-10 ENCOUNTER — Ambulatory Visit (HOSPITAL_BASED_OUTPATIENT_CLINIC_OR_DEPARTMENT_OTHER)
Admission: RE | Admit: 2019-04-10 | Discharge: 2019-04-10 | Disposition: A | Discharge status: Home / Self Care | Payer: BC Managed Care – PPO | Source: Ambulatory Visit | Attending: Family | Admitting: Family

## 2019-04-10 ENCOUNTER — Other Ambulatory Visit: Payer: Self-pay

## 2019-04-10 DIAGNOSIS — N281 Cyst of kidney, acquired: Secondary | ICD-10-CM | POA: Diagnosis not present

## 2019-04-10 DIAGNOSIS — R945 Abnormal results of liver function studies: Secondary | ICD-10-CM | POA: Diagnosis not present

## 2019-04-12 ENCOUNTER — Telehealth: Payer: Self-pay | Admitting: Family

## 2019-04-12 DIAGNOSIS — R7989 Other specified abnormal findings of blood chemistry: Secondary | ICD-10-CM

## 2019-04-12 DIAGNOSIS — R945 Abnormal results of liver function studies: Secondary | ICD-10-CM

## 2019-04-12 NOTE — Telephone Encounter (Signed)
No concerning findings on abdominal US. I would like him to return to the lab work for some additional testing to check his liver. Testing is pended.  How much alcohol does he drink/how often?

## 2019-04-12 NOTE — Telephone Encounter (Signed)
Patient reports he only drinks "some weekends and special occasions 1 glass of wine , beer every now and then.  Patient advised of [place] results and scheduled for labs.

## 2019-04-18 ENCOUNTER — Other Ambulatory Visit: Payer: Self-pay

## 2019-04-18 ENCOUNTER — Other Ambulatory Visit (INDEPENDENT_AMBULATORY_CARE_PROVIDER_SITE_OTHER): Payer: BC Managed Care – PPO

## 2019-04-18 DIAGNOSIS — R945 Abnormal results of liver function studies: Secondary | ICD-10-CM

## 2019-04-18 DIAGNOSIS — R7989 Other specified abnormal findings of blood chemistry: Secondary | ICD-10-CM

## 2019-04-19 LAB — HEPATITIS PANEL, ACUTE
Hep A IgM: NONREACTIVE
Hep B C IgM: NONREACTIVE
Hepatitis B Surface Ag: NONREACTIVE
Hepatitis C Ab: NONREACTIVE
SIGNAL TO CUT-OFF: 0.09 (ref <1.00)

## 2019-04-19 LAB — COMPREHENSIVE METABOLIC PANEL
ALT: 15 U/L (ref 0–53)
AST: 25 U/L (ref 0–37)
Albumin: 4.7 g/dL (ref 3.5–5.2)
Alkaline Phosphatase: 66 U/L (ref 39–117)
BUN: 21 mg/dL (ref 6–23)
CO2: 26 mEq/L (ref 19–32)
Calcium: 9.9 mg/dL (ref 8.4–10.5)
Chloride: 102 mEq/L (ref 96–112)
Creatinine, Ser: 1 mg/dL (ref 0.40–1.50)
GFR: 91.6 mL/min (ref >60.00)
Glucose, Bld: 107 mg/dL — ABNORMAL HIGH (ref 70–99)
Potassium: 3.8 mEq/L (ref 3.5–5.1)
Sodium: 139 mEq/L (ref 135–145)
Total Bilirubin: 0.7 mg/dL (ref 0.2–1.2)
Total Protein: 7.7 g/dL (ref 6.0–8.3)

## 2019-04-19 LAB — LIPASE: Lipase: 33 U/L (ref 11.0–59.0)

## 2019-04-20 ENCOUNTER — Telehealth: Payer: Self-pay

## 2019-05-30 ENCOUNTER — Other Ambulatory Visit: Payer: Self-pay | Admitting: Family

## 2019-08-13 ENCOUNTER — Other Ambulatory Visit: Payer: Self-pay | Admitting: Family

## 2019-08-13 DIAGNOSIS — E1169 Type 2 diabetes mellitus with other specified complication: Secondary | ICD-10-CM

## 2019-08-29 ENCOUNTER — Other Ambulatory Visit: Payer: Self-pay | Admitting: Family

## 2019-10-10 ENCOUNTER — Other Ambulatory Visit: Payer: Self-pay

## 2019-10-10 ENCOUNTER — Ambulatory Visit: Payer: BC Managed Care – PPO | Admitting: Medical

## 2019-10-10 VITALS — BP 129/75 | HR 82 | Temp 98.2°F | Resp 18 | Ht 66.0 in | Wt 235.6 lb

## 2019-10-10 DIAGNOSIS — M79675 Pain in left toe(s): Secondary | ICD-10-CM | POA: Diagnosis not present

## 2019-10-10 DIAGNOSIS — M79609 Pain in unspecified limb: Secondary | ICD-10-CM

## 2019-10-10 NOTE — Patient Instructions (Addendum)
For rt popliteal pain I placed order for [place] to be done stat. Scheduling difficulty/impossible  getting in today outpt and you could not get off work to do in am tomorrow(not convinced presentation suspicious enough to require ED presently).  So need to get tomorrow at 4:30. Need to register thru ED before US done.  If during the interim you have more pain, leg swelling or shortness of breath then you need to see ED and get study thru the ED.  Referral to podiatrist placed.  Follow up date to be determined after image review.  Can continue ibuprofen.

## 2019-10-10 NOTE — Progress Notes (Signed)
   Subjective:    Patient ID: Drew Harris, male    DOB: 03-23-1955, 63 y.o.   MRN: [id number]  HPI  Pt in with some rt leg pain back side of leg. Pain when he stands. Also when he sits. He stands on tip toes has rt popliteal pain. He has no sob.  Pain for 2 weeks. No hx of dvt.  Also left great toe pain. Callous type formation. When he wears work boot it works  Pt has used some ibuprofen and helps some.  Pt state has some mild left si area pain. No pain that radiates down left leg.   Review of Systems     Objective:   Physical Exam  General- No acute distress. Pleasant patient. Neck- Full range of motion, no jvd Lungs- Clear, even and unlabored. Heart- regular rate and rhythm. Neurologic- CNII- XII grossly intact.  Right lower extremity-appears symmetric to left lower extremity.  Particularly calves symmetric.  No right-sided pedal edema.  Negative Homans' sign.  Right calf is not red or warm to touch.  However he does have direct tenderness to palpation in the popliteal fossa.  Left foot-normal coloration, no warmth or tenderness.  No swelling.  No obvious hallux valgus of the great toe.  The medial aspect the great toe does appear to have callus present.      Assessment & Plan:  For rt popliteal pain I placed order for [place] to be done stat. Scheduling difficulty/impossible  getting in today outpt and you could not get off work to do in am tomorrow(not convinced presentation suspicious enough to require ED presently).  So need to get tomorrow at 4:30. Need to register thru ED before US done.  If during the interim you have more pain, leg swelling or shortness of breath then you need to see ED and get study thru the ED.  Referral to podiatrist placed.  Follow up date to be determined after image review.  Can continue ibuprofen.  Note at the time of exam lab was already closed so D-dimer was not practical.  Time spent with patient today was 36  minutes which consisted  of chart review, discussing diagnosis, explained work-up, treatment and documentation.  Some time needed to coordinate scheduling.

## 2019-10-11 ENCOUNTER — Ambulatory Visit (HOSPITAL_BASED_OUTPATIENT_CLINIC_OR_DEPARTMENT_OTHER)
Admission: RE | Admit: 2019-10-11 | Discharge: 2019-10-11 | Disposition: A | Discharge status: Home / Self Care | Payer: BC Managed Care – PPO | Source: Ambulatory Visit | Attending: Medical | Admitting: Medical

## 2019-10-11 ENCOUNTER — Other Ambulatory Visit (HOSPITAL_BASED_OUTPATIENT_CLINIC_OR_DEPARTMENT_OTHER): Payer: BC Managed Care – PPO

## 2019-10-11 DIAGNOSIS — M79609 Pain in unspecified limb: Secondary | ICD-10-CM | POA: Insufficient documentation

## 2019-10-11 DIAGNOSIS — M79661 Pain in right lower leg: Secondary | ICD-10-CM | POA: Diagnosis not present

## 2019-10-16 DIAGNOSIS — H5203 Hypermetropia, bilateral: Secondary | ICD-10-CM | POA: Diagnosis not present

## 2019-10-16 DIAGNOSIS — H524 Presbyopia: Secondary | ICD-10-CM | POA: Diagnosis not present

## 2019-10-16 DIAGNOSIS — H40023 Open angle with borderline findings, high risk, bilateral: Secondary | ICD-10-CM | POA: Diagnosis not present

## 2019-10-16 DIAGNOSIS — H40053 Ocular hypertension, bilateral: Secondary | ICD-10-CM | POA: Diagnosis not present

## 2019-10-16 DIAGNOSIS — H2513 Age-related nuclear cataract, bilateral: Secondary | ICD-10-CM | POA: Diagnosis not present

## 2019-10-16 DIAGNOSIS — E119 Type 2 diabetes mellitus without complications: Secondary | ICD-10-CM | POA: Diagnosis not present

## 2019-10-16 DIAGNOSIS — H52223 Regular astigmatism, bilateral: Secondary | ICD-10-CM | POA: Diagnosis not present

## 2019-10-16 LAB — HM DIABETES EYE EXAM

## 2019-11-26 ENCOUNTER — Other Ambulatory Visit: Payer: Self-pay | Admitting: Family

## 2019-12-08 DIAGNOSIS — Z03818 Encounter for observation for suspected exposure to other biological agents ruled out: Secondary | ICD-10-CM | POA: Diagnosis not present

## 2020-02-16 ENCOUNTER — Telehealth: Payer: Self-pay | Admitting: Family

## 2020-02-16 ENCOUNTER — Other Ambulatory Visit: Payer: Self-pay | Admitting: Family

## 2020-02-16 NOTE — Telephone Encounter (Signed)
Refill sent, however pt is past due for follow up. Please call pt to schedule a follow up visit.

## 2020-02-19 ENCOUNTER — Other Ambulatory Visit: Payer: Self-pay | Admitting: Family

## 2020-02-19 NOTE — Telephone Encounter (Signed)
Spoke to pt.. he will call us back to schedule appt

## 2020-03-04 ENCOUNTER — Other Ambulatory Visit: Payer: Self-pay | Admitting: Family

## 2020-03-04 NOTE — Telephone Encounter (Signed)
Please advise which dose of losartan Pt should be taking. 25mg  or 50mg ?

## 2020-03-05 NOTE — Telephone Encounter (Signed)
Tried calling patient. No answer. Left detailed message for patient that we would need to see him for any further refills.

## 2020-03-05 NOTE — Telephone Encounter (Signed)
7 day supply was sent, however he is very overdue for follow up. Will need OV prior to additional refills.  Please call pt to schedule.

## 2020-04-25 ENCOUNTER — Ambulatory Visit: Payer: BC Managed Care – PPO | Admitting: Family

## 2020-04-25 ENCOUNTER — Encounter: Payer: Self-pay | Admitting: Family

## 2020-04-25 ENCOUNTER — Other Ambulatory Visit: Payer: Self-pay

## 2020-04-25 ENCOUNTER — Telehealth: Payer: Self-pay | Admitting: Family

## 2020-04-25 VITALS — BP 138/90 | HR 98 | Temp 98.8°F | Resp 16 | Ht 66.0 in | Wt 228.0 lb

## 2020-04-25 DIAGNOSIS — F528 Other sexual dysfunction not due to a substance or known physiological condition: Secondary | ICD-10-CM

## 2020-04-25 DIAGNOSIS — E118 Type 2 diabetes mellitus with unspecified complications: Secondary | ICD-10-CM | POA: Diagnosis not present

## 2020-04-25 DIAGNOSIS — E1169 Type 2 diabetes mellitus with other specified complication: Secondary | ICD-10-CM | POA: Diagnosis not present

## 2020-04-25 DIAGNOSIS — E785 Hyperlipidemia, unspecified: Secondary | ICD-10-CM | POA: Diagnosis not present

## 2020-04-25 DIAGNOSIS — Z0000 Encounter for general adult medical examination without abnormal findings: Secondary | ICD-10-CM

## 2020-04-25 MED ORDER — GABAPENTIN 100 MG PO CAPS: 100.0000 mg | ORAL_CAPSULE | Freq: Three times a day (TID) | ORAL | 3 refills | Status: DC

## 2020-04-25 MED ORDER — LOSARTAN POTASSIUM 50 MG PO TABS: 50.0000 mg | ORAL_TABLET | Freq: Every day | ORAL | 1 refills | Status: DC

## 2020-04-25 MED ORDER — FENOFIBRATE 145 MG PO TABS: 145.0000 mg | ORAL_TABLET | Freq: Every day | ORAL | 1 refills | Status: DC

## 2020-04-25 MED ORDER — AMLODIPINE BESYLATE 10 MG PO TABS: 10.0000 mg | ORAL_TABLET | Freq: Every day | ORAL | 1 refills | Status: DC

## 2020-04-25 MED ORDER — ROSUVASTATIN CALCIUM 20 MG PO TABS: 20.0000 mg | ORAL_TABLET | Freq: Every day | ORAL | 1 refills | Status: DC

## 2020-04-25 NOTE — Telephone Encounter (Signed)
Dr. Madelin Rear office is closed for the day, will fax request on monday

## 2020-04-25 NOTE — Patient Instructions (Signed)
Please begin gabapentin 3 x daily for foot and back pain.

## 2020-04-25 NOTE — Telephone Encounter (Signed)
Please call [facility], Dr. Clydene Laming and request DM eye exam.

## 2020-04-25 NOTE — Progress Notes (Signed)
Subjective:    Patient ID: Drew Harris, male    DOB: 02/04/1956, 65 y.o.   MRN: [id number]  HPI  Patient is a 65 yr old male who presents today for follow up.    DM2- maintained on diabetic diet.  Lab Results  Component Value Date   HGBA1C 5.6 03/07/2019   HGBA1C 5.7 09/05/2017   HGBA1C 5.8 12/27/2016   Lab Results  Component Value Date   MICROALBUR 4.8 (H) 06/11/2014   LDLCALC 113 (H) 03/07/2019   CREATININE 1.00 04/18/2019   HTN- maintained on amlodipine 10mg , losartan 50mg .  BP Readings from Last 3 Encounters:  04/25/20 138/90  10/10/19 129/75  03/07/19 (!) 142/86   Hyperlipidemia- maintained on crestor 20mg  and fenofibrate.    ED- uses revatio prn. Reports that it is working "ok."   [employer] from Last 3 Encounters:  04/25/20 228 lb (103.4 kg)  10/10/19 235 lb 9.6 oz (106.9 kg)  12/27/17 215 lb (97.5 kg)       Review of Systems See HPI  Past Medical History:  Diagnosis Date  . Allergic rhinitis   . Borderline diabetes    Type 2  . Diabetes mellitus without complication ([place])   . ED (erectile dysfunction)   . Hyperlipidemia   . Hypertension      Social History   Socioeconomic History  . Marital status: Single    Spouse name: Not on file  . Number of children: Not on file  . Years of education: Not on file  . Highest education level: Not on file  Occupational History  . Not on file  Tobacco Use  . Smoking status: Current Some Day Smoker  . Smokeless tobacco: Never Used  . Tobacco comment: Pt somkes occasionally  Substance and Sexual Activity  . Alcohol use: Yes  . Drug use: No  . Sexual activity: Not on file  Other Topics Concern  . 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 APPENDECTOMY      Family History  Problem Relation Age of Onset  . Prostate cancer Father        deceased age 80 secondary to prostate cancer  . Hypertension Mother   . Diabetes type II Sister     Allergies  Allergen Reactions  . Ace Inhibitors     ?cough  . Atorvastatin Nausea Only    "Dark urine"    Current Outpatient Medications on File Prior to Visit  Medication Sig Dispense Refill  . amLODipine (NORVASC) 10 MG tablet TAKE 1 TABLET BY MOUTH EVERY DAY 90 tablet 1  . aspirin EC 81 MG tablet Take 81 mg by mouth daily.    . fenofibrate (TRICOR) 145 MG tablet Take 1 tablet (145 mg total) by mouth daily. 90 tablet 1  . losartan (COZAAR) 50 MG tablet TAKE 1 TABLET BY MOUTH EVERY DAY 7 tablet 0  . meloxicam (MOBIC) 7.5 MG tablet Take 1 tablet (7.5 mg total) by mouth daily as needed for pain. 14 tablet 0  . Omega-3 Fatty Acids (FISH OIL)  1000 MG CAPS Take 2,000 mg by mouth 2 (two) times daily. Reported on 05/05/2015    . ondansetron (ZOFRAN) 4 MG tablet Take 1 tablet (4 mg total) by mouth every 8 (eight) hours as needed for nausea or vomiting. 20 tablet 0  . rosuvastatin (CRESTOR) 20 MG tablet TAKE 1 TABLET BY MOUTH EVERY DAY 90 tablet 1  . sildenafil (REVATIO) 20 MG tablet TAKE ONE TO TWO TABLETS BY MOUTH ONE HOUR PRIOR TO SEXUAL ACTIVITY DAILY AS NEEDED 50 tablet 0   No current facility-administered medications on file prior to visit.    BP 138/90 (BP Location: Right Arm, Patient Position: Sitting, Cuff Size: Large)   Pulse 98   Temp 98.8 F (37.1 C) (Oral)   Resp 16   Ht 5\' 6"  (1.676 m)   Wt 228 lb (103.4 kg)   SpO2 100%   BMI 36.80 kg/m       Objective:   Physical Exam Constitutional:      General: He is not in acute distress.    Appearance: He is well-developed and well-nourished.  HENT:     Head: Normocephalic and atraumatic.   Cardiovascular:     Rate and Rhythm: Normal rate and regular rhythm.     Heart sounds: No murmur heard.   Pulmonary:     Effort: Pulmonary effort is normal. No respiratory distress.     Breath sounds: Normal breath sounds. No wheezing or rales.  Musculoskeletal:        General: No edema.  Skin:    General: Skin is warm and dry.  Neurological:     Mental Status: He is alert and oriented to person, place, and time.  Psychiatric:        Mood and Affect: Mood and affect normal.        Behavior: Behavior normal.        Thought Content: Thought content normal.           Assessment & Plan:  DM2- clinically stable.  Obtain follow up A1C.   HTN- BP stable. Continue  amlodipine 10mg , losartan 50mg .   Hyperlipidemia- obtain follow up lipid panel. Continue crestor 20mg  and fenofibrate.  ED- reports fair response to sildenafil 20mg . Discussed referral to Urology.  He declines at this time.  This visit occurred during the SARS-CoV-2 public health emergency.  Safety protocols were in place, including screening questions prior to the visit, additional usage of staff PPE, and extensive cleaning of exam room while observing appropriate contact time as indicated for disinfecting solutions.

## 2020-04-26 ENCOUNTER — Encounter: Payer: Self-pay | Admitting: Family

## 2020-04-29 ENCOUNTER — Encounter: Payer: Self-pay | Admitting: Gastroenterology

## 2020-05-06 ENCOUNTER — Other Ambulatory Visit: Payer: Self-pay

## 2020-05-06 ENCOUNTER — Other Ambulatory Visit (INDEPENDENT_AMBULATORY_CARE_PROVIDER_SITE_OTHER): Payer: BC Managed Care – PPO

## 2020-05-06 DIAGNOSIS — E785 Hyperlipidemia, unspecified: Secondary | ICD-10-CM

## 2020-05-06 DIAGNOSIS — E118 Type 2 diabetes mellitus with unspecified complications: Secondary | ICD-10-CM

## 2020-05-06 NOTE — Addendum Note (Signed)
Addended by: Manuela Schwartz on: 05/06/2020 03:17 PM   Modules accepted: Orders

## 2020-05-07 ENCOUNTER — Encounter: Payer: Self-pay | Admitting: Family

## 2020-05-07 LAB — COMPREHENSIVE METABOLIC PANEL
ALT: 12 U/L (ref 0–53)
AST: 20 U/L (ref 0–37)
Albumin: 4.5 g/dL (ref 3.5–5.2)
Alkaline Phosphatase: 57 U/L (ref 39–117)
BUN: 29 mg/dL — ABNORMAL HIGH (ref 6–23)
CO2: 25 mEq/L (ref 19–32)
Calcium: 9.2 mg/dL (ref 8.4–10.5)
Chloride: 100 mEq/L (ref 96–112)
Creatinine, Ser: 1.02 mg/dL (ref 0.40–1.50)
GFR: 78.44 mL/min (ref >60.00)
Glucose, Bld: 96 mg/dL (ref 70–99)
Potassium: 3.8 mEq/L (ref 3.5–5.1)
Sodium: 136 mEq/L (ref 135–145)
Total Bilirubin: 0.5 mg/dL (ref 0.2–1.2)
Total Protein: 7.3 g/dL (ref 6.0–8.3)

## 2020-05-07 LAB — LIPID PANEL
Cholesterol: 176 mg/dL (ref 0–200)
HDL: 93.3 mg/dL (ref >39.00)
LDL Cholesterol: 68 mg/dL (ref 0–99)
NonHDL: 82.52
Total CHOL/HDL Ratio: 2
Triglycerides: 72 mg/dL (ref 0.0–149.0)
VLDL: 14.4 mg/dL (ref 0.0–40.0)

## 2020-05-07 LAB — HEMOGLOBIN A1C: Hgb A1c MFr Bld: 5.7 % (ref 4.6–6.5)

## 2020-05-07 NOTE — Progress Notes (Signed)
Mailed out to pt 

## 2020-06-18 ENCOUNTER — Other Ambulatory Visit: Payer: Self-pay

## 2020-06-18 ENCOUNTER — Ambulatory Visit (AMBULATORY_SURGERY_CENTER): Payer: Self-pay | Admitting: *Deleted

## 2020-06-18 VITALS — Ht 66.0 in | Wt 234.0 lb

## 2020-06-18 DIAGNOSIS — Z1211 Encounter for screening for malignant neoplasm of colon: Secondary | ICD-10-CM

## 2020-06-18 MED ORDER — PEG 3350-KCL-NA BICARB-NACL 420 G PO SOLR: 4000.0000 mL | Freq: Once | ORAL | 0 refills | Status: AC

## 2020-06-18 NOTE — Progress Notes (Signed)
Patient and wife is here in-person for PV. Patient denies any allergies to eggs or soy. Patient denies any problems with anesthesia/sedation. Patient denies any oxygen use at home. Patient denies taking any diet/weight loss medications or blood thinners. Patient is not being treated for MRSA or C-diff. Patient is aware of our care-partner policy and [id number] safety protocol.   Patient is fully COVID-19 vaccinated, per patient.

## 2020-06-27 ENCOUNTER — Encounter: Payer: Self-pay | Admitting: Gastroenterology

## 2020-06-27 ENCOUNTER — Telehealth (INDEPENDENT_AMBULATORY_CARE_PROVIDER_SITE_OTHER): Payer: BC Managed Care – PPO | Admitting: Medical

## 2020-06-27 ENCOUNTER — Encounter: Payer: Self-pay | Admitting: Medical

## 2020-06-27 ENCOUNTER — Other Ambulatory Visit: Payer: Self-pay

## 2020-06-27 VITALS — BP 135/70 | HR 92 | Resp 18 | Ht 66.0 in | Wt 233.8 lb

## 2020-06-27 DIAGNOSIS — M79671 Pain in right foot: Secondary | ICD-10-CM

## 2020-06-27 DIAGNOSIS — L603 Nail dystrophy: Secondary | ICD-10-CM | POA: Diagnosis not present

## 2020-06-27 DIAGNOSIS — L84 Corns and callosities: Secondary | ICD-10-CM | POA: Diagnosis not present

## 2020-06-27 NOTE — Progress Notes (Signed)
Subjective:    Patient ID: Drew Harris, male    DOB: [birth year], 65 y.o.   MRN: [id number]  HPI  Pt came in for in office visit. Was video at first but video failed and had to resort to telephone. I wanted to do exam as with lower leg complaints wanted to make sure his complaint did not warrant dvt. Make sure [place] not needed.  Pt shows me pain is in achilles tendon area.  Describes pain for weeks.  No trauma or fall..More pain back of rt heel. Also left great toe pain medial aspect.  He describes having to work 6 days a week 8-hour days.  He wears steel toe shoes.  He thinks that the shoes fit appropriately.   Pt is diabetic and a1c conrtolled.Pt not on diabetic meds.   Review of Systems  Constitutional: Negative for chills, fatigue and fever.  HENT: Negative for congestion and drooling.   Respiratory: Negative for cough, chest tightness and wheezing.   Cardiovascular: Negative for chest pain and palpitations.  Genitourinary: Negative for dysuria.  Musculoskeletal: Negative for back pain.       See HPI.  Skin: Negative for rash.       See HPI.  Neurological: Negative for dizziness, speech difficulty, weakness, light-headedness, numbness and headaches.  Hematological: Negative for adenopathy. Does not bruise/bleed easily.  Psychiatric/Behavioral: Negative for behavioral problems, confusion and self-injury.   Past Medical History:  Diagnosis Date  . Allergic rhinitis   . Borderline diabetes    Type 2  . Diabetes mellitus without complication ([facility])    diet control-no meds  . ED (erectile dysfunction)   . Hyperlipidemia   . Hypertension      Social History   Socioeconomic History  . Marital status: Single    Spouse name: Not on file  . Number of children: Not on file  . Years of education: Not on file  . Highest education level: Not on file  Occupational History  . Not on file  Tobacco Use  . Smoking status: Current Every Day Smoker    Packs/day: 0.25    Types:  Cigarettes  . Smokeless tobacco: Never Used  . Tobacco comment: Pt somkes occasionally  Vaping Use  . Vaping Use: Never used  Substance and Sexual Activity  . Alcohol use: Yes    Alcohol/week: 3.0 standard drinks    Types: 3 Standard drinks or equivalent per week  . Drug use: No  . Sexual activity: Not on file  Other Topics Concern  . Not on file  Social History Narrative   [name].  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 APPENDECTOMY    . COLONOSCOPY  08/11/2007   Deatra Ina  . NASAL POLYP SURGERY      Family History  Problem Relation Age of Onset  . Prostate cancer Father        deceased age 21 secondary to prostate cancer  . Hypertension Mother   . Diabetes type II Sister   . Colon polyps Neg Hx   . Esophageal cancer Neg Hx   .  Rectal cancer Neg Hx   . Stomach cancer Neg Hx   . Colon cancer Neg Hx     Allergies  Allergen Reactions  . Ace Inhibitors     ?cough  . Atorvastatin Nausea Only    "Dark urine"    Current Outpatient Medications on File Prior to Visit  Medication Sig Dispense Refill  . amLODipine (NORVASC) 10 MG tablet Take 1 tablet (10 mg total) by mouth daily. 90 tablet 1  . aspirin EC 81 MG tablet Take 81 mg by mouth daily.    . fenofibrate (TRICOR) 145 MG tablet Take 1 tablet (145 mg total) by mouth daily. 90 tablet 1  . gabapentin (NEURONTIN) 100 MG capsule Take 1 capsule (100 mg total) by mouth 3 (three) times daily. 90 capsule 3  . losartan (COZAAR) 50 MG tablet Take 1 tablet (50 mg total) by mouth daily. 90 tablet 1  . meloxicam (MOBIC) 7.5 MG tablet Take 1 tablet (7.5 mg total) by mouth daily  as needed for pain. 14 tablet 0  . Omega-3 Fatty Acids (FISH OIL) 1000 MG CAPS Take 2,000 mg by mouth 2 (two) times daily. Reported on 05/05/2015    . rosuvastatin (CRESTOR) 20 MG tablet Take 1 tablet (20 mg total) by mouth daily. 90 tablet 1  . sildenafil (REVATIO) 20 MG tablet TAKE ONE TO TWO TABLETS BY MOUTH ONE HOUR PRIOR TO SEXUAL ACTIVITY DAILY AS NEEDED 50 tablet 0   No current facility-administered medications on file prior to visit.    BP (!) 141/79   Pulse 92   Resp 18   Ht 5\' 6"  (1.676 m)   Wt 233 lb 12.8 oz (106.1 kg)   SpO2 100%   BMI 37.74 kg/m       Objective:   Physical Exam  General Mental Status- Alert. General Appearance- Not in acute distress.   Skin General: Color- Normal Color. Moisture- Normal Moisture.    Chest and Lung Exam Auscultation: Breath Sounds:-Normal.  Cardiovascular Auscultation:Rythm- Regular. Murmurs & Other Heart Sounds:Auscultation of the heart reveals- No Murmurs.  Abdomen Inspection:-Inspeection Normal. Palpation/Percussion:Note:No mass. Palpation and Percussion of the abdomen reveal- Non Tender, Non Distended + BS, no rebound or guarding.   Neurologic Cranial Nerve exam:- CN III-XII intact(No nystagmus), symmetric smile. Strength:- 5/5 equal and symmetric strength both upper and lower extremities.  Lower ext- calfs not swollen, negative homans sign. Symmetric.  No redness or warmth to calves. Rt achilles tendon pain where it inserts into calcaneus. Left foot- normal but callous on medial aspect great toe.  Both feet- thick dystrophic nails.      Assessment & Plan:  You appear to have achlles tendonitis type pain. Advise can use tylenol and low dose ibuprofen. Ace wrap given to wrap. Will go ahead and refer to podiatrist.   Also get opinion on callous and dystrophic nails.  Follow up 7 days or as needed  [employer], [employer]

## 2020-06-27 NOTE — Patient Instructions (Addendum)
You appear to have achlles tendonitis type pain. Advise can use tylenol and low dose ibuprofen. Ace wrap given to wrap. Will go ahead and refer to podiatrist.   Also get opinion on callous and dystrophic nails.  Follow up 7 days or as needed

## 2020-07-02 ENCOUNTER — Other Ambulatory Visit: Payer: Self-pay | Admitting: Gastroenterology

## 2020-07-02 ENCOUNTER — Other Ambulatory Visit: Payer: Self-pay

## 2020-07-02 ENCOUNTER — Ambulatory Visit (AMBULATORY_SURGERY_CENTER): Payer: BC Managed Care – PPO | Admitting: Gastroenterology

## 2020-07-02 ENCOUNTER — Encounter: Payer: Self-pay | Admitting: Gastroenterology

## 2020-07-02 VITALS — BP 144/88 | HR 66 | Temp 97.1°F | Resp 14 | Ht 66.0 in | Wt 234.0 lb

## 2020-07-02 DIAGNOSIS — Z1211 Encounter for screening for malignant neoplasm of colon: Secondary | ICD-10-CM

## 2020-07-02 DIAGNOSIS — D122 Benign neoplasm of ascending colon: Secondary | ICD-10-CM

## 2020-07-02 MED ORDER — SODIUM CHLORIDE 0.9 % IV SOLN: 500.0000 mL | Freq: Once | INTRAVENOUS | Status: DC

## 2020-07-02 NOTE — Progress Notes (Signed)
Pt Drowsy. VSS. To PACU, report to RN. No anesthetic complications noted.  

## 2020-07-02 NOTE — Progress Notes (Signed)
Called to room to assist during endoscopic procedure.  Patient ID and intended procedure confirmed with present staff. Received instructions for my participation in the procedure from the performing physician.  

## 2020-07-02 NOTE — Op Note (Signed)
[facility] Patient Name: Drew Harris Procedure Date: 07/02/2020 10:49 AM MRN: [id number] Endoscopist: Mallie Mussel L. Loletha Carrow , MD Age: 65 Referring MD:  Date of Birth: 06-21-1955 Gender: Male Account #: [id number] Procedure:                Colonoscopy Indications:              Screening for colorectal malignant neoplasm (no                            polyps last colonoscopy June 2009) Medicines:                Monitored Anesthesia Care Procedure:                Pre-Anesthesia Assessment:                           - Prior to the procedure, a History and Physical                            was performed, and patient medications and                            allergies were reviewed. The patient's tolerance of                            previous anesthesia was also reviewed. The risks                            and benefits of the procedure and the sedation                            options and risks were discussed with the patient.                            All questions were answered, and informed consent                            was obtained. Prior Anticoagulants: The patient has                            taken no previous anticoagulant or antiplatelet                            agents. ASA Grade Assessment: II - A patient with                            mild systemic disease. After reviewing the risks                            and benefits, the patient was deemed in                            satisfactory condition to undergo the procedure.  After obtaining informed consent, the colonoscope                            was passed under direct vision. Throughout the                            procedure, the patient's blood pressure, pulse, and                            oxygen saturations were monitored continuously. The                            Olympus CF-HQ190L [id number]) Colonoscope was                            introduced through the anus and  advanced to the the                            cecum, identified by appendiceal orifice and                            ileocecal valve. The colonoscopy was performed                            without difficulty. The patient tolerated the                            procedure well. The quality of the bowel                            preparation was good. The ileocecal valve,                            appendiceal orifice, and rectum were photographed. Scope In: 11:04:24 AM Scope Out: 11:21:26 AM Scope Withdrawal Time: 0 hours 14 minutes 13 seconds  Total Procedure Duration: 0 hours 17 minutes 2 seconds  Findings:                 The perianal and digital rectal examinations were                            normal.                           Three sessile polyps were found in the proximal                            ascending colon. The polyps were 2 to 5 mm in size.                            These polyps were removed with a cold snare.                            Resection and retrieval were complete.  Multiple diverticula were found in the left colon                            and right colon.                           The exam was otherwise without abnormality on                            direct and retroflexion views. Complications:            No immediate complications. Estimated Blood Loss:     Estimated blood loss was minimal. Impression:               - Three 2 to 5 mm polyps in the proximal ascending                            colon, removed with a cold snare. Resected and                            retrieved. Recommendation:           - Patient has a contact number available for                            emergencies. The signs and symptoms of potential                            delayed complications were discussed with the                            patient. Return to normal activities tomorrow.                            Written discharge instructions  were provided to the                            patient.                           - Resume previous diet.                           - Continue present medications.                           - Await pathology results.                           - Repeat colonoscopy is recommended for                            surveillance. The colonoscopy date will be                            determined after pathology results from today's  exam become available for review. Azizah Lisle L. Myrtie Neither, MD 07/02/2020 11:26:11 AM This report has been signed electronically.

## 2020-07-02 NOTE — Progress Notes (Signed)
Vitals-CW  Pt's states no medical or surgical changes since previsit or office visit. 

## 2020-07-02 NOTE — Progress Notes (Signed)
Vitals-CW ?

## 2020-07-02 NOTE — Patient Instructions (Signed)
Discharge instructions given. Handouts on polyps and Diverticulosis. Resume previous medications. YOU HAD AN ENDOSCOPIC PROCEDURE TODAY AT [facility]:   Refer to the procedure report that was given to you for any specific questions about what was found during the examination.  If the procedure report does not answer your questions, please call your gastroenterologist to clarify.  If you requested that your care partner not be given the details of your procedure findings, then the procedure report has been included in a sealed envelope for you to review at your convenience later.  YOU SHOULD EXPECT: Some feelings of bloating in the abdomen. Passage of more gas than usual.  Walking can help get rid of the air that was put into your GI tract during the procedure and reduce the bloating. If you had a lower endoscopy (such as a colonoscopy or flexible sigmoidoscopy) you may notice spotting of blood in your stool or on the toilet paper. If you underwent a bowel prep for your procedure, you may not have a normal bowel movement for a few days.  Please Note:  You might notice some irritation and congestion in your nose or some drainage.  This is from the oxygen used during your procedure.  There is no need for concern and it should clear up in a day or so.  SYMPTOMS TO REPORT IMMEDIATELY:    Following upper endoscopy (EGD)  Vomiting of blood or coffee ground material  New chest pain or pain under the shoulder blades  Painful or persistently difficult swallowing  New shortness of breath  Fever of 100F or higher  Black, tarry-looking stools  For urgent or emergent issues, a gastroenterologist can be reached at any hour by calling [phone]. Do not use MyChart messaging for urgent concerns.    DIET:  We do recommend a small meal at first, but then you may proceed to your regular diet.  Drink plenty of fluids but you should avoid alcoholic beverages for 24 hours.  ACTIVITY:  You  should plan to take it easy for the rest of today and you should NOT DRIVE or use heavy machinery until tomorrow (because of the sedation medicines used during the test).    FOLLOW UP: Our staff will call the number listed on your records 48-72 hours following your procedure to check on you and address any questions or concerns that you may have regarding the information given to you following your procedure. If we do not reach you, we will leave a message.  We will attempt to reach you two times.  During this call, we will ask if you have developed any symptoms of COVID 19. If you develop any symptoms (ie: fever, flu-like symptoms, shortness of breath, cough etc.) before then, please call [phone].  If you test positive for Covid 19 in the 2 weeks post procedure, please call and report this information to [place].    If any biopsies were taken you will be contacted by phone or by letter within the next 1-3 weeks.  Please call us at [phone] if you have not heard about the biopsies in 3 weeks.    SIGNATURES/CONFIDENTIALITY: You and/or your care partner have signed paperwork which will be entered into your electronic medical record.  These signatures attest to the fact that that the information above on your After Visit Summary has been reviewed and is understood.  Full responsibility of the confidentiality of this discharge information lies with you and/or your care-partner.

## 2020-07-04 ENCOUNTER — Telehealth: Payer: Self-pay | Admitting: *Deleted

## 2020-07-04 ENCOUNTER — Telehealth: Payer: Self-pay

## 2020-07-04 NOTE — Telephone Encounter (Signed)
Attempted f/u phone call. No answer. Left message. °

## 2020-07-04 NOTE — Telephone Encounter (Signed)
Left message on 2nd follow up call. 

## 2020-07-08 ENCOUNTER — Other Ambulatory Visit: Payer: Self-pay

## 2020-07-08 ENCOUNTER — Encounter: Payer: Self-pay | Admitting: Podiatry

## 2020-07-08 ENCOUNTER — Ambulatory Visit: Payer: BC Managed Care – PPO | Admitting: Podiatry

## 2020-07-08 ENCOUNTER — Ambulatory Visit (INDEPENDENT_AMBULATORY_CARE_PROVIDER_SITE_OTHER): Payer: BC Managed Care – PPO

## 2020-07-08 DIAGNOSIS — M722 Plantar fascial fibromatosis: Secondary | ICD-10-CM

## 2020-07-08 DIAGNOSIS — E118 Type 2 diabetes mellitus with unspecified complications: Secondary | ICD-10-CM | POA: Diagnosis not present

## 2020-07-08 DIAGNOSIS — L84 Corns and callosities: Secondary | ICD-10-CM | POA: Diagnosis not present

## 2020-07-08 DIAGNOSIS — M7661 Achilles tendinitis, right leg: Secondary | ICD-10-CM

## 2020-07-08 MED ORDER — MELOXICAM 15 MG PO TABS
15.0000 mg | ORAL_TABLET | Freq: Every day | ORAL | 3 refills | Status: DC
Start: 2020-07-08 — End: 2021-05-08

## 2020-07-08 NOTE — Patient Instructions (Signed)
Look for urea 40% cream or ointment and apply to the thickened dry skin / calluses. This can be bought over the counter, at a pharmacy or online such as [employer].   Achilles Tendinitis  with Rehab Achilles tendinitis is a disorder of the Achilles tendon. The Achilles tendon connects the large calf muscles (Gastrocnemius and Soleus) to the heel bone (calcaneus). This tendon is sometimes called the heel cord. It is important for pushing-off and standing on your toes and is important for walking, running, or jumping. Tendinitis is often caused by overuse and repetitive microtrauma. SYMPTOMS Pain, tenderness, swelling, warmth, and redness may occur over the Achilles tendon even at rest. Pain with pushing off, or flexing or extending the ankle. Pain that is worsened after or during activity. CAUSES  Overuse sometimes seen with rapid increase in exercise programs or in sports requiring running and jumping. Poor physical conditioning (strength and flexibility or endurance). Running sports, especially training running down hills. Inadequate warm-up before practice or play or failure to stretch before participation. Injury to the tendon. PREVENTION  Warm up and stretch before practice or competition. Allow time for adequate rest and recovery between practices and competition. Keep up conditioning. Keep up ankle and leg flexibility. Improve or keep muscle strength and endurance. Improve cardiovascular fitness. Use proper technique. Use proper equipment (shoes, skates). To help prevent recurrence, taping, protective strapping, or an adhesive bandage may be recommended for several weeks after healing is complete. PROGNOSIS  Recovery may take weeks to several months to heal. Longer recovery is expected if symptoms have been prolonged. Recovery is usually quicker if the inflammation is due to a direct blow as compared with overuse or sudden strain. RELATED COMPLICATIONS  Healing time will be prolonged  if the condition is not correctly treated. The injury must be given plenty of time to heal. Symptoms can reoccur if activity is resumed too soon. Untreated, tendinitis may increase the risk of tendon rupture requiring additional time for recovery and possibly surgery. TREATMENT  The first treatment consists of rest anti-inflammatory medication, and ice to relieve the pain. Stretching and strengthening exercises after resolution of pain will likely help reduce the risk of recurrence. Referral to a physical therapist or athletic trainer for further evaluation and treatment may be helpful. A walking boot or cast may be recommended to rest the Achilles tendon. This can help break the cycle of inflammation and microtrauma. Arch supports (orthotics) may be prescribed or recommended by your caregiver as an adjunct to therapy and rest. Surgery to remove the inflamed tendon lining or degenerated tendon tissue is rarely necessary and has shown less than predictable results. MEDICATION  Nonsteroidal anti-inflammatory medications, such as aspirin and ibuprofen, may be used for pain and inflammation relief. Do not take within 7 days before surgery. Take these as directed by your caregiver. Contact your caregiver immediately if any bleeding, stomach upset, or signs of allergic reaction occur. Other minor pain relievers, such as acetaminophen, may also be used. Pain relievers may be prescribed as necessary by your caregiver. Do not take prescription pain medication for longer than 4 to 7 days. Use only as directed and only as much as you need. Cortisone injections are rarely indicated. Cortisone injections may weaken tendons and predispose to rupture. It is better to give the condition more time to heal than to use them. HEAT AND COLD Cold is used to relieve pain and reduce inflammation for acute and chronic Achilles tendinitis. Cold should be applied for 10 to 15   minutes every 2 to 3 hours for inflammation and pain  and immediately after any activity that aggravates your symptoms. Use ice packs or an ice massage. Heat may be used before performing stretching and strengthening activities prescribed by your caregiver. Use a heat pack or a warm soak. SEEK MEDICAL CARE IF: Symptoms get worse or do not improve in 2 weeks despite treatment. New, unexplained symptoms develop. Drugs used in treatment may produce side effects.  EXERCISES:  RANGE OF MOTION (ROM) AND STRETCHING EXERCISES - Achilles Tendinitis  These exercises may help you when beginning to rehabilitate your injury. Your symptoms may resolve with or without further involvement from your physician, physical therapist or athletic trainer. While completing these exercises, remember:  Restoring tissue flexibility helps normal motion to return to the joints. This allows healthier, less painful movement and activity. An effective stretch should be held for at least 30 seconds. A stretch should never be painful. You should only feel a gentle lengthening or release in the stretched tissue.  STRETCH  Gastroc, Standing  Place hands on wall. Extend right / left leg, keeping the front knee somewhat bent. Slightly point your toes inward on your back foot. Keeping your right / left heel on the floor and your knee straight, shift your weight toward the wall, not allowing your back to arch. You should feel a gentle stretch in the right / left calf. Hold this position for 10 seconds. Repeat 3 times. Complete this stretch 2 times per day.  STRETCH  Soleus, Standing  Place hands on wall. Extend right / left leg, keeping the other knee somewhat bent. Slightly point your toes inward on your back foot. Keep your right / left heel on the floor, bend your back knee, and slightly shift your weight over the back leg so that you feel a gentle stretch deep in your back calf. Hold this position for 10 seconds. Repeat 3 times. Complete this stretch 2 times per  day.  STRETCH  Gastrocsoleus, Standing  Note: This exercise can place a lot of stress on your foot and ankle. Please complete this exercise only if specifically instructed by your caregiver.  Place the ball of your right / left foot on a step, keeping your other foot firmly on the same step. Hold on to the wall or a rail for balance. Slowly lift your other foot, allowing your body weight to press your heel down over the edge of the step. You should feel a stretch in your right / left calf. Hold this position for 10 seconds. Repeat this exercise with a slight bend in your knee. Repeat 3 times. Complete this stretch 2 times per day.   STRENGTHENING EXERCISES - Achilles Tendinitis These exercises may help you when beginning to rehabilitate your injury. They may resolve your symptoms with or without further involvement from your physician, physical therapist or athletic trainer. While completing these exercises, remember:  Muscles can gain both the endurance and the strength needed for everyday activities through controlled exercises. Complete these exercises as instructed by your physician, physical therapist or athletic trainer. Progress the resistance and repetitions only as guided. You may experience muscle soreness or fatigue, but the pain or discomfort you are trying to eliminate should never worsen during these exercises. If this pain does worsen, stop and make certain you are following the directions exactly. If the pain is still present after adjustments, discontinue the exercise until you can discuss the trouble with your clinician.  STRENGTH - Plantar-flexors    Sit with your right / left leg extended. Holding onto both ends of a rubber exercise band/tubing, loop it around the ball of your foot. Keep a slight tension in the band. Slowly push your toes away from you, pointing them downward. Hold this position for 10 seconds. Return slowly, controlling the tension in the band/tubing. Repeat  3 times. Complete this exercise 2 times per day.   STRENGTH - Plantar-flexors  Stand with your feet shoulder width apart. Steady yourself with a wall or table using as little support as needed. Keeping your weight evenly spread over the width of your feet, rise up on your toes.* Hold this position for 10 seconds. Repeat 3 times. Complete this exercise 2 times per day.  *If this is too easy, shift your weight toward your right / left leg until you feel challenged. Ultimately, you may be asked to do this exercise with your right / left foot only.  STRENGTH  Plantar-flexors, Eccentric  Note: This exercise can place a lot of stress on your foot and ankle. Please complete this exercise only if specifically instructed by your caregiver.  Place the balls of your feet on a step. With your hands, use only enough support from a wall or rail to keep your balance. Keep your knees straight and rise up on your toes. Slowly shift your weight entirely to your right / left toes and pick up your opposite foot. Gently and with controlled movement, lower your weight through your right / left foot so that your heel drops below the level of the step. You will feel a slight stretch in the back of your calf at the end position. Use the healthy leg to help rise up onto the balls of both feet, then lower weight only on the right / left leg again. Build up to 15 repetitions. Then progress to 3 consecutive sets of 15 repetitions.* After completing the above exercise, complete the same exercise with a slight knee bend (about 30 degrees). Again, build up to 15 repetitions. Then progress to 3 consecutive sets of 15 repetitions.* Perform this exercise 2 times per day.  *When you easily complete 3 sets of 15, your physician, physical therapist or athletic trainer may advise you to add resistance by wearing a backpack filled with additional weight.  STRENGTH - Plantar Flexors, Seated  Sit on a chair that allows your feet to rest  flat on the ground. If necessary, sit at the edge of the chair. Keeping your toes firmly on the ground, lift your right / left heel as far as you can without increasing any discomfort in your ankle. Repeat 3 times. Complete this exercise 2 times a day.  

## 2020-07-09 ENCOUNTER — Encounter: Payer: Self-pay | Admitting: Podiatry

## 2020-07-09 ENCOUNTER — Telehealth: Payer: Self-pay

## 2020-07-09 NOTE — Telephone Encounter (Signed)
Order and demographics faxed to [facility]  M76.61 Three times a week for six to ten weeks Fax confirmation received

## 2020-07-09 NOTE — Progress Notes (Signed)
  Subjective:  Patient ID: Drew Harris, male    DOB: 20-Apr-1955,  MRN: [id number]  Chief Complaint  Patient presents with  . Foot Pain    ) Dystrophic nail, Pre-ulcerative corn or callous, Pain of right heel    65 y.o. male presents with the above complaint. History confirmed with patient.  Describes pain in the right heel is burning sharp shooting pain on which causes swelling.  Is been present for about 6 months.  He works on Clinical [employer].  Also some painful callus on the left big toe.  He is no longer on medication for diabetes he has been on metformin for some time and is diet controlled  Objective:  Physical Exam: warm, good capillary refill, no trophic changes or ulcerative lesions, normal DP and PT pulses and normal sensory exam. Left Foot: Callus distal hallux Right Foot: He has gastrocnemius equinus as well as pain palpation of the Achilles tendon at the insertion of the heel bone  Radiographs: X-ray of the right foot: no fracture, dislocation, swelling or degenerative changes noted and posterior calcaneal spur Assessment:   1. Tendonitis, Achilles, right   2. Callus of foot   3. Controlled type 2 diabetes mellitus with complication, without long-term current use of insulin ([place])      Plan:  Patient was evaluated and treated and all questions answered.  Discussed the etiology and treatment options for Achilles tendinitis including stretching, formal physical therapy with an eccentric exercises therapy plan, supportive shoegears such as a running shoe or sneaker, heel lifts, topical and oral medications.  We also discussed that I do not routinely perform injections in this area because of the risk of an increased damage or rupture of the tendon.  We also discussed the role of surgical treatment of this for patients who do not improve after exhausting non-surgical treatment options.  -XR reviewed with patient -Educated on stretching and icing of  the affected limb. -Referral placed to physical therapy at [facility] in [place]. -Rx for Meloxicam. Advised on risks, benefits, and alternatives of the medication  -Recommend CAM boot immobilization and rest off work for 6 weeks  All symptomatic hyperkeratoses were safely debrided with a sterile #15 blade to patient's level of comfort without incident. We discussed preventative and palliative care of these lesions including supportive and accommodative shoegear, padding, prefabricated and custom molded accommodative orthoses, use of a pumice stone and lotions/creams daily.   Return in about 6 weeks (around 08/19/2020) for re-check Achilles tendon.

## 2020-07-14 ENCOUNTER — Encounter: Payer: Self-pay | Admitting: Gastroenterology

## 2020-07-15 DIAGNOSIS — R262 Difficulty in walking, not elsewhere classified: Secondary | ICD-10-CM | POA: Diagnosis not present

## 2020-07-15 DIAGNOSIS — M25571 Pain in right ankle and joints of right foot: Secondary | ICD-10-CM | POA: Diagnosis not present

## 2020-07-15 DIAGNOSIS — R531 Weakness: Secondary | ICD-10-CM | POA: Diagnosis not present

## 2020-07-15 DIAGNOSIS — M25671 Stiffness of right ankle, not elsewhere classified: Secondary | ICD-10-CM | POA: Diagnosis not present

## 2020-07-17 DIAGNOSIS — M25671 Stiffness of right ankle, not elsewhere classified: Secondary | ICD-10-CM | POA: Diagnosis not present

## 2020-07-17 DIAGNOSIS — R531 Weakness: Secondary | ICD-10-CM | POA: Diagnosis not present

## 2020-07-17 DIAGNOSIS — M25571 Pain in right ankle and joints of right foot: Secondary | ICD-10-CM | POA: Diagnosis not present

## 2020-07-17 DIAGNOSIS — R262 Difficulty in walking, not elsewhere classified: Secondary | ICD-10-CM | POA: Diagnosis not present

## 2020-07-23 ENCOUNTER — Other Ambulatory Visit: Payer: Self-pay | Admitting: Podiatry

## 2020-07-23 DIAGNOSIS — M7661 Achilles tendinitis, right leg: Secondary | ICD-10-CM

## 2020-07-24 DIAGNOSIS — R531 Weakness: Secondary | ICD-10-CM | POA: Diagnosis not present

## 2020-07-24 DIAGNOSIS — R262 Difficulty in walking, not elsewhere classified: Secondary | ICD-10-CM | POA: Diagnosis not present

## 2020-07-24 DIAGNOSIS — M25571 Pain in right ankle and joints of right foot: Secondary | ICD-10-CM | POA: Diagnosis not present

## 2020-07-24 DIAGNOSIS — M25671 Stiffness of right ankle, not elsewhere classified: Secondary | ICD-10-CM | POA: Diagnosis not present

## 2020-07-25 DIAGNOSIS — R262 Difficulty in walking, not elsewhere classified: Secondary | ICD-10-CM | POA: Diagnosis not present

## 2020-07-25 DIAGNOSIS — R531 Weakness: Secondary | ICD-10-CM | POA: Diagnosis not present

## 2020-07-25 DIAGNOSIS — M25671 Stiffness of right ankle, not elsewhere classified: Secondary | ICD-10-CM | POA: Diagnosis not present

## 2020-07-25 DIAGNOSIS — M25571 Pain in right ankle and joints of right foot: Secondary | ICD-10-CM | POA: Diagnosis not present

## 2020-07-29 ENCOUNTER — Encounter: Payer: BC Managed Care – PPO | Admitting: Family

## 2020-08-07 DIAGNOSIS — M25671 Stiffness of right ankle, not elsewhere classified: Secondary | ICD-10-CM | POA: Diagnosis not present

## 2020-08-07 DIAGNOSIS — M25571 Pain in right ankle and joints of right foot: Secondary | ICD-10-CM | POA: Diagnosis not present

## 2020-08-07 DIAGNOSIS — R262 Difficulty in walking, not elsewhere classified: Secondary | ICD-10-CM | POA: Diagnosis not present

## 2020-08-07 DIAGNOSIS — R531 Weakness: Secondary | ICD-10-CM | POA: Diagnosis not present

## 2020-08-12 ENCOUNTER — Encounter: Payer: BC Managed Care – PPO | Admitting: Family

## 2020-08-13 DIAGNOSIS — R262 Difficulty in walking, not elsewhere classified: Secondary | ICD-10-CM | POA: Diagnosis not present

## 2020-08-13 DIAGNOSIS — M25571 Pain in right ankle and joints of right foot: Secondary | ICD-10-CM | POA: Diagnosis not present

## 2020-08-13 DIAGNOSIS — M25671 Stiffness of right ankle, not elsewhere classified: Secondary | ICD-10-CM | POA: Diagnosis not present

## 2020-08-13 DIAGNOSIS — R531 Weakness: Secondary | ICD-10-CM | POA: Diagnosis not present

## 2020-08-21 DIAGNOSIS — R262 Difficulty in walking, not elsewhere classified: Secondary | ICD-10-CM | POA: Diagnosis not present

## 2020-08-21 DIAGNOSIS — M25571 Pain in right ankle and joints of right foot: Secondary | ICD-10-CM | POA: Diagnosis not present

## 2020-08-21 DIAGNOSIS — M25671 Stiffness of right ankle, not elsewhere classified: Secondary | ICD-10-CM | POA: Diagnosis not present

## 2020-08-21 DIAGNOSIS — R531 Weakness: Secondary | ICD-10-CM | POA: Diagnosis not present

## 2020-08-25 ENCOUNTER — Other Ambulatory Visit: Payer: Self-pay

## 2020-08-25 ENCOUNTER — Ambulatory Visit: Payer: BC Managed Care – PPO | Admitting: Podiatry

## 2020-08-25 ENCOUNTER — Encounter: Payer: Self-pay | Admitting: Podiatry

## 2020-08-25 DIAGNOSIS — M7661 Achilles tendinitis, right leg: Secondary | ICD-10-CM

## 2020-08-26 ENCOUNTER — Encounter: Payer: Self-pay | Admitting: Podiatry

## 2020-08-26 NOTE — Progress Notes (Signed)
  Subjective:  Patient ID: Drew Harris, male    DOB: June 28, 1955,  MRN: [id number]  Chief Complaint  Patient presents with   Tendonitis    Pt states right achilles is still tender but better than before    65 y.o. male returns for follow-up with the above complaint. History confirmed with patient.  He has been doing physical therapy at [facility], he has had some improvement.  Probably least 50% better than it was.  Objective:  Physical Exam: warm, good capillary refill, no trophic changes or ulcerative lesions, normal DP and PT pulses and normal sensory exam. Left Foot: Callus distal hallux Right Foot: He has gastrocnemius equinus as well as pain palpation of the Achilles tendon at the insertion of the heel bone  Radiographs: X-ray of the right foot: no fracture, dislocation, swelling or degenerative changes noted and posterior calcaneal spur Assessment:   1. Tendonitis, Achilles, right      Plan:  Patient was evaluated and treated and all questions answered.  Discussed the etiology and treatment options for Achilles tendinitis including stretching, formal physical therapy with an eccentric exercises therapy plan, supportive shoegears such as a running shoe or sneaker, heel lifts, topical and oral medications.  We also discussed that I do not routinely perform injections in this area because of the risk of an increased damage or rupture of the tendon.  We also discussed the role of surgical treatment of this for patients who do not improve after exhausting non-surgical treatment options.  -Continue stretching and icing of the affected limb. -Continue physical therapy at [facility] in [place]. -Continue meloxicam as needed -Begin to transition out of cam boot over the next 4 weeks.  Hopeful he can go back to work at that point, follow-up me in 6 weeks.     Return in about 6 weeks (around 10/06/2020) for re-check Achilles tendon.

## 2020-09-03 DIAGNOSIS — R531 Weakness: Secondary | ICD-10-CM | POA: Diagnosis not present

## 2020-09-03 DIAGNOSIS — M25671 Stiffness of right ankle, not elsewhere classified: Secondary | ICD-10-CM | POA: Diagnosis not present

## 2020-09-03 DIAGNOSIS — M25571 Pain in right ankle and joints of right foot: Secondary | ICD-10-CM | POA: Diagnosis not present

## 2020-09-03 DIAGNOSIS — R262 Difficulty in walking, not elsewhere classified: Secondary | ICD-10-CM | POA: Diagnosis not present

## 2020-09-10 DIAGNOSIS — R531 Weakness: Secondary | ICD-10-CM | POA: Diagnosis not present

## 2020-09-10 DIAGNOSIS — M25671 Stiffness of right ankle, not elsewhere classified: Secondary | ICD-10-CM | POA: Diagnosis not present

## 2020-09-10 DIAGNOSIS — R262 Difficulty in walking, not elsewhere classified: Secondary | ICD-10-CM | POA: Diagnosis not present

## 2020-09-10 DIAGNOSIS — M25571 Pain in right ankle and joints of right foot: Secondary | ICD-10-CM | POA: Diagnosis not present

## 2020-09-16 DIAGNOSIS — M25571 Pain in right ankle and joints of right foot: Secondary | ICD-10-CM | POA: Diagnosis not present

## 2020-09-16 DIAGNOSIS — R531 Weakness: Secondary | ICD-10-CM | POA: Diagnosis not present

## 2020-09-16 DIAGNOSIS — R262 Difficulty in walking, not elsewhere classified: Secondary | ICD-10-CM | POA: Diagnosis not present

## 2020-09-16 DIAGNOSIS — M25671 Stiffness of right ankle, not elsewhere classified: Secondary | ICD-10-CM | POA: Diagnosis not present

## 2020-09-17 ENCOUNTER — Encounter: Payer: Self-pay | Admitting: Podiatry

## 2020-09-17 ENCOUNTER — Telehealth: Payer: Self-pay | Admitting: Podiatry

## 2020-09-17 NOTE — Telephone Encounter (Signed)
That's fine

## 2020-09-17 NOTE — Telephone Encounter (Signed)
Patient is still attending PT, he is scheduled to return to work 09/22/2020. Drew Harris would like to extend return to work until after next appt. 10/06/2020, Is that ok?

## 2020-09-18 DIAGNOSIS — R262 Difficulty in walking, not elsewhere classified: Secondary | ICD-10-CM | POA: Diagnosis not present

## 2020-09-18 DIAGNOSIS — M25671 Stiffness of right ankle, not elsewhere classified: Secondary | ICD-10-CM | POA: Diagnosis not present

## 2020-09-18 DIAGNOSIS — M25571 Pain in right ankle and joints of right foot: Secondary | ICD-10-CM | POA: Diagnosis not present

## 2020-09-18 DIAGNOSIS — R531 Weakness: Secondary | ICD-10-CM | POA: Diagnosis not present

## 2020-09-25 DIAGNOSIS — R531 Weakness: Secondary | ICD-10-CM | POA: Diagnosis not present

## 2020-09-25 DIAGNOSIS — M25671 Stiffness of right ankle, not elsewhere classified: Secondary | ICD-10-CM | POA: Diagnosis not present

## 2020-09-25 DIAGNOSIS — R262 Difficulty in walking, not elsewhere classified: Secondary | ICD-10-CM | POA: Diagnosis not present

## 2020-09-25 DIAGNOSIS — M25571 Pain in right ankle and joints of right foot: Secondary | ICD-10-CM | POA: Diagnosis not present

## 2020-09-30 DIAGNOSIS — M25571 Pain in right ankle and joints of right foot: Secondary | ICD-10-CM | POA: Diagnosis not present

## 2020-09-30 DIAGNOSIS — R531 Weakness: Secondary | ICD-10-CM | POA: Diagnosis not present

## 2020-09-30 DIAGNOSIS — M25671 Stiffness of right ankle, not elsewhere classified: Secondary | ICD-10-CM | POA: Diagnosis not present

## 2020-09-30 DIAGNOSIS — R262 Difficulty in walking, not elsewhere classified: Secondary | ICD-10-CM | POA: Diagnosis not present

## 2020-10-01 DIAGNOSIS — R262 Difficulty in walking, not elsewhere classified: Secondary | ICD-10-CM | POA: Diagnosis not present

## 2020-10-01 DIAGNOSIS — M25671 Stiffness of right ankle, not elsewhere classified: Secondary | ICD-10-CM | POA: Diagnosis not present

## 2020-10-01 DIAGNOSIS — R531 Weakness: Secondary | ICD-10-CM | POA: Diagnosis not present

## 2020-10-01 DIAGNOSIS — M25571 Pain in right ankle and joints of right foot: Secondary | ICD-10-CM | POA: Diagnosis not present

## 2020-10-06 ENCOUNTER — Other Ambulatory Visit: Payer: Self-pay

## 2020-10-06 ENCOUNTER — Encounter: Payer: Self-pay | Admitting: Podiatry

## 2020-10-06 ENCOUNTER — Ambulatory Visit: Payer: BC Managed Care – PPO | Admitting: Podiatry

## 2020-10-06 DIAGNOSIS — M7661 Achilles tendinitis, right leg: Secondary | ICD-10-CM

## 2020-10-07 NOTE — Progress Notes (Signed)
  Subjective:  Patient ID: Drew Harris, male    DOB: 1955-07-25,  MRN: [id number]  Chief Complaint  Patient presents with   Tendonitis    6 week follow up,  re-check Achilles tendon right    65 y.o. male returns for follow-up with the above complaint. History confirmed with patient.  He is doing much better thinks he is 80-90% improved.  Objective:  Physical Exam: warm, good capillary refill, no trophic changes or ulcerative lesions, normal DP and PT pulses and normal sensory exam. Left Foot: Callus distal hallux Right Foot: Minimal to no pain today has 5 out of 5 strength  Radiographs: X-ray of the right foot: no fracture, dislocation, swelling or degenerative changes noted and posterior calcaneal spur Assessment:   1. Tendonitis, Achilles, right      Plan:  Patient was evaluated and treated and all questions answered.  Discussed the etiology and treatment options for Achilles tendinitis including stretching, formal physical therapy with an eccentric exercises therapy plan, supportive shoegears such as a running shoe or sneaker, heel lifts, topical and oral medications.  We also discussed that I do not routinely perform injections in this area because of the risk of an increased damage or rupture of the tendon.  We also discussed the role of surgical treatment of this for patients who do not improve after exhausting non-surgical treatment options.  -Doing well he will complete therapy and continue his stretching home exercise plan and take Aloxi cam as needed.  Expect this to resolve at this point.  Return as needed if it returns     Return if symptoms worsen or fail to improve.

## 2020-10-27 ENCOUNTER — Other Ambulatory Visit: Payer: Self-pay | Admitting: Family

## 2020-11-11 ENCOUNTER — Other Ambulatory Visit: Payer: Self-pay | Admitting: Family

## 2021-02-15 ENCOUNTER — Other Ambulatory Visit: Payer: Self-pay | Admitting: Family

## 2021-02-27 ENCOUNTER — Encounter: Payer: BC Managed Care – PPO | Admitting: Family

## 2021-05-08 ENCOUNTER — Encounter: Payer: Self-pay | Admitting: Family

## 2021-05-08 ENCOUNTER — Other Ambulatory Visit: Payer: Self-pay

## 2021-05-08 ENCOUNTER — Telehealth: Payer: Self-pay | Admitting: Family

## 2021-05-08 ENCOUNTER — Other Ambulatory Visit: Payer: Self-pay | Admitting: Family

## 2021-05-08 ENCOUNTER — Ambulatory Visit: Payer: BC Managed Care – PPO | Admitting: Family

## 2021-05-08 ENCOUNTER — Encounter: Payer: BC Managed Care – PPO | Admitting: Family

## 2021-05-08 VITALS — BP 120/73 | HR 89 | Temp 98.4°F | Resp 16 | Ht 68.5 in | Wt 226.0 lb

## 2021-05-08 DIAGNOSIS — E785 Hyperlipidemia, unspecified: Secondary | ICD-10-CM

## 2021-05-08 DIAGNOSIS — E1169 Type 2 diabetes mellitus with other specified complication: Secondary | ICD-10-CM

## 2021-05-08 DIAGNOSIS — E1142 Type 2 diabetes mellitus with diabetic polyneuropathy: Secondary | ICD-10-CM | POA: Insufficient documentation

## 2021-05-08 DIAGNOSIS — K219 Gastro-esophageal reflux disease without esophagitis: Secondary | ICD-10-CM

## 2021-05-08 DIAGNOSIS — I10 Essential (primary) hypertension: Secondary | ICD-10-CM | POA: Diagnosis not present

## 2021-05-08 DIAGNOSIS — E118 Type 2 diabetes mellitus with unspecified complications: Secondary | ICD-10-CM | POA: Diagnosis not present

## 2021-05-08 DIAGNOSIS — N529 Male erectile dysfunction, unspecified: Secondary | ICD-10-CM

## 2021-05-08 DIAGNOSIS — F17200 Nicotine dependence, unspecified, uncomplicated: Secondary | ICD-10-CM

## 2021-05-08 LAB — COMPREHENSIVE METABOLIC PANEL
ALT: 11 U/L (ref 0–53)
AST: 18 U/L (ref 0–37)
Albumin: 4.7 g/dL (ref 3.5–5.2)
Alkaline Phosphatase: 54 U/L (ref 39–117)
BUN: 35 mg/dL — ABNORMAL HIGH (ref 6–23)
CO2: 26 mEq/L (ref 19–32)
Calcium: 9.5 mg/dL (ref 8.4–10.5)
Chloride: 103 mEq/L (ref 96–112)
Creatinine, Ser: 0.91 mg/dL (ref 0.40–1.50)
GFR: 89.32 mL/min (ref >60.00)
Glucose, Bld: 95 mg/dL (ref 70–99)
Potassium: 4.6 mEq/L (ref 3.5–5.1)
Sodium: 138 mEq/L (ref 135–145)
Total Bilirubin: 0.5 mg/dL (ref 0.2–1.2)
Total Protein: 7.5 g/dL (ref 6.0–8.3)

## 2021-05-08 LAB — HEMOGLOBIN A1C: Hgb A1c MFr Bld: 5.8 % (ref 4.6–6.5)

## 2021-05-08 LAB — LIPID PANEL
Cholesterol: 171 mg/dL (ref 0–200)
HDL: 77.2 mg/dL (ref >39.00)
LDL Cholesterol: 60 mg/dL (ref 0–99)
NonHDL: 93.76
Total CHOL/HDL Ratio: 2
Triglycerides: 170 mg/dL — ABNORMAL HIGH (ref 0.0–149.0)
VLDL: 34 mg/dL (ref 0.0–40.0)

## 2021-05-08 MED ORDER — GABAPENTIN 300 MG PO CAPS: 300.0000 mg | ORAL_CAPSULE | Freq: Three times a day (TID) | ORAL | 1 refills | Status: DC

## 2021-05-08 MED ORDER — ROSUVASTATIN CALCIUM 20 MG PO TABS: 20.0000 mg | ORAL_TABLET | Freq: Every day | ORAL | 1 refills | Status: DC

## 2021-05-08 MED ORDER — SILDENAFIL CITRATE 20 MG PO TABS: ORAL_TABLET | ORAL | 0 refills | Status: DC

## 2021-05-08 MED ORDER — LOSARTAN POTASSIUM 50 MG PO TABS: 50.0000 mg | ORAL_TABLET | Freq: Every day | ORAL | 1 refills | Status: DC

## 2021-05-08 MED ORDER — FENOFIBRATE 145 MG PO TABS: ORAL_TABLET | ORAL | 1 refills | Status: DC

## 2021-05-08 NOTE — Assessment & Plan Note (Signed)
Not yet ready to quit. Discussed chantix. He will think about it.  ?

## 2021-05-08 NOTE — Patient Instructions (Signed)
Please get your bivalent Covid Booster at the pharmacy.  ?

## 2021-05-08 NOTE — Assessment & Plan Note (Signed)
Reports fair response to Sildenafil. Wishes to continue. Refill sent.  ?
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
## 2021-05-08 NOTE — Assessment & Plan Note (Signed)
Uncontrolled on gabapentin 100 mg tid. Will increase to 300mg  TID.  ?

## 2021-05-08 NOTE — Telephone Encounter (Signed)
[facility] and [facility]ild[facility]afil sent to [facility].  ? ?Medication: amLODipine (NORVASC) 10 MG tablet  ? ?[facility]: [id number] [place], [place]  ?[address] [place] [address]  ?Phone:  [phone]  Fax:  [phone]  ? ?Medication: sildenafil (REVATIO) 20 MG tablet  ? ?Preferred Pharmacy: [facility] ?[place], [place], [place] [address] ?[phone] ? ? ?

## 2021-05-08 NOTE — Assessment & Plan Note (Signed)
Stable without medication. Monitor.  

## 2021-05-08 NOTE — Assessment & Plan Note (Signed)
Lab Results  ?Component Value Date  ? HGBA1C 5.7 05/06/2020  ? HGBA1C 5.6 03/07/2019  ? HGBA1C 5.7 09/05/2017  ? ?Lab Results  ?Component Value Date  ? MICROALBUR 4.8 (H) 06/11/2014  ? [place] 68 05/06/2020  ? CREATININE 1.02 05/06/2020  ? ?Wt Readings from Last 3 Encounters:  ?05/08/21 226 lb (102.5 kg)  ?07/02/20 234 lb (106.1 kg)  ?06/27/20 233 lb 12.8 oz (106.1 kg)  ? ?Diet is "pretty good."  Does a lot of walking at work.  ?

## 2021-05-08 NOTE — Assessment & Plan Note (Addendum)
Lab Results  ?Component Value Date  ? CHOL 176 05/06/2020  ? HDL 93.30 05/06/2020  ? [place] 68 05/06/2020  ? LDLDIRECT 40.0 06/02/2015  ? TRIG 72.0 05/06/2020  ? CHOLHDL 2 05/06/2020  ? ?Recheck lipids, contine crestor/fish oil/fenofibrate. ?

## 2021-05-08 NOTE — Assessment & Plan Note (Addendum)
BP Readings from Last 3 Encounters:  ?05/08/21 120/73  ?07/02/20 (!) 144/88  ?06/27/20 135/70  ? ?BP at goal. Continue amlodipine and losartan.  ?

## 2021-05-08 NOTE — Telephone Encounter (Signed)
[facility] to [facility] ?

## 2021-05-11 ENCOUNTER — Encounter: Payer: Self-pay | Admitting: Family

## 2021-05-11 NOTE — Progress Notes (Signed)
Mailed out to patient 

## 2021-05-26 ENCOUNTER — Encounter: Payer: BC Managed Care – PPO | Admitting: Family

## 2021-06-16 ENCOUNTER — Ambulatory Visit (INDEPENDENT_AMBULATORY_CARE_PROVIDER_SITE_OTHER): Payer: BC Managed Care – PPO | Admitting: Family

## 2021-06-16 ENCOUNTER — Encounter: Payer: Self-pay | Admitting: Family

## 2021-06-16 ENCOUNTER — Telehealth: Payer: Self-pay | Admitting: Family

## 2021-06-16 VITALS — BP 149/76 | HR 99 | Temp 99.7°F | Resp 16 | Wt 229.0 lb

## 2021-06-16 DIAGNOSIS — Z23 Encounter for immunization: Secondary | ICD-10-CM | POA: Diagnosis not present

## 2021-06-16 DIAGNOSIS — Z125 Encounter for screening for malignant neoplasm of prostate: Secondary | ICD-10-CM | POA: Diagnosis not present

## 2021-06-16 DIAGNOSIS — Z0000 Encounter for general adult medical examination without abnormal findings: Secondary | ICD-10-CM | POA: Diagnosis not present

## 2021-06-16 NOTE — Assessment & Plan Note (Addendum)
Discussed healthy diet, exercise, weight loss. ?He is not yet ready to quit smoking. ?Check PSA today. ?Shingrix #1 and Tdap today. ?Colo up to date.  ? ?Wt Readings from Last 3 Encounters:  ?06/16/21 229 lb (103.9 kg)  ?05/08/21 226 lb (102.5 kg)  ?07/02/20 234 lb (106.1 kg)  ? ? ?
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
## 2021-06-16 NOTE — Assessment & Plan Note (Signed)
BP Readings from Last 3 Encounters:  ?06/16/21 (!) 149/76  ?05/08/21 120/73  ?07/02/20 (!) 144/88  ? ? ?

## 2021-06-16 NOTE — Telephone Encounter (Signed)
Please call [facility] on [address] and request DM eye exam. ?

## 2021-06-17 ENCOUNTER — Encounter: Payer: Self-pay | Admitting: Family

## 2021-06-17 LAB — PSA: PSA: 0.18 ng/mL (ref 0.10–4.00)

## 2021-06-17 NOTE — Telephone Encounter (Signed)
Request sent 

## 2021-06-17 NOTE — Progress Notes (Signed)
Mailed out to patient 

## 2021-08-07 ENCOUNTER — Other Ambulatory Visit: Payer: Self-pay | Admitting: Family

## 2021-08-07 DIAGNOSIS — E785 Hyperlipidemia, unspecified: Secondary | ICD-10-CM

## 2021-09-15 ENCOUNTER — Ambulatory Visit: Payer: BC Managed Care – PPO | Admitting: Family

## 2021-09-15 ENCOUNTER — Encounter: Payer: Self-pay | Admitting: Family

## 2021-09-15 VITALS — BP 156/100 | HR 115 | Temp 98.2°F | Resp 18 | Ht 68.0 in | Wt 222.6 lb

## 2021-09-15 DIAGNOSIS — E118 Type 2 diabetes mellitus with unspecified complications: Secondary | ICD-10-CM | POA: Diagnosis not present

## 2021-09-15 DIAGNOSIS — E1142 Type 2 diabetes mellitus with diabetic polyneuropathy: Secondary | ICD-10-CM | POA: Diagnosis not present

## 2021-09-15 DIAGNOSIS — E785 Hyperlipidemia, unspecified: Secondary | ICD-10-CM

## 2021-09-15 DIAGNOSIS — N529 Male erectile dysfunction, unspecified: Secondary | ICD-10-CM

## 2021-09-15 DIAGNOSIS — R7989 Other specified abnormal findings of blood chemistry: Secondary | ICD-10-CM

## 2021-09-15 DIAGNOSIS — F17200 Nicotine dependence, unspecified, uncomplicated: Secondary | ICD-10-CM

## 2021-09-15 DIAGNOSIS — Z23 Encounter for immunization: Secondary | ICD-10-CM

## 2021-09-15 DIAGNOSIS — I10 Essential (primary) hypertension: Secondary | ICD-10-CM | POA: Diagnosis not present

## 2021-09-15 MED ORDER — LOSARTAN POTASSIUM 100 MG PO TABS: 100.0000 mg | ORAL_TABLET | Freq: Every day | ORAL | 0 refills | Status: DC
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
## 2021-09-15 NOTE — Assessment & Plan Note (Signed)
Lab Results  Component Value Date   CHOL 171 05/08/2021   HDL 77.20 05/08/2021   LDLCALC 60 05/08/2021   LDLDIRECT 40.0 06/02/2015   TRIG 170.0 (H) 05/08/2021   CHOLHDL 2 05/08/2021   Will repeat lipids today to assess triglycerides.

## 2021-09-15 NOTE — Assessment & Plan Note (Signed)
Sub-optimal response to sildenafil '20mg'$  tabs. Recommended trial of '40mg'$  (2 tabs) prior to sexual activity.

## 2021-09-15 NOTE — Assessment & Plan Note (Signed)
Lab Results  Component Value Date   HGBA1C 5.8 05/08/2021   Last A1C at goal with diet. Repeat A1C today.  Weight is down a few more pounds. He is planning to walk more now that he is retired.  Wt Readings from Last 3 Encounters:  09/15/21 222 lb 9.6 oz (101 kg)  06/16/21 229 lb (103.9 kg)  05/08/21 226 lb (102.5 kg)

## 2021-09-15 NOTE — Assessment & Plan Note (Addendum)
BP Readings from Last 3 Encounters:  09/15/21 (!) 156/100  06/16/21 (!) 149/76  05/08/21 120/73   Uncontrolled.  Continue amlodipine '10mg'$  once daily, increase losartan from '50mg'$  to '100mg'$  once daily. Follow up in 1 week.

## 2021-09-15 NOTE — Assessment & Plan Note (Signed)
Had negative hemachromatosis work up in the past. Check follow up LFT/ferritin level.

## 2021-09-15 NOTE — Addendum Note (Signed)
Addended by: Sanda Linger on: 09/15/2021 01:30 PM   Modules accepted: Orders

## 2021-09-15 NOTE — Assessment & Plan Note (Signed)
Stopped gabapentin as he did not find improvement in his symptoms. Monitor.

## 2021-09-15 NOTE — Assessment & Plan Note (Addendum)
He is down to 3-4 cigarettes/day. Encouraged complete cessation.

## 2021-09-15 NOTE — Patient Instructions (Signed)
Please increase losartan to '100mg'$  once daily. Complete lab work prior to leaving.

## 2021-09-16 ENCOUNTER — Telehealth: Payer: Self-pay | Admitting: Family

## 2021-09-16 DIAGNOSIS — R7989 Other specified abnormal findings of blood chemistry: Secondary | ICD-10-CM

## 2021-09-16 LAB — HEPATIC FUNCTION PANEL
ALT: 48 U/L (ref 0–53)
AST: 104 U/L — ABNORMAL HIGH (ref 0–37)
Albumin: 4.4 g/dL (ref 3.5–5.2)
Alkaline Phosphatase: 83 U/L (ref 39–117)
Bilirubin, Direct: 0.2 mg/dL (ref 0.0–0.3)
Total Bilirubin: 0.7 mg/dL (ref 0.2–1.2)
Total Protein: 7.5 g/dL (ref 6.0–8.3)

## 2021-09-16 LAB — HEMOGLOBIN A1C: Hgb A1c MFr Bld: 6.1 % (ref 4.6–6.5)

## 2021-09-16 LAB — BASIC METABOLIC PANEL
BUN: 26 mg/dL — ABNORMAL HIGH (ref 6–23)
CO2: 21 mEq/L (ref 19–32)
Calcium: 8.9 mg/dL (ref 8.4–10.5)
Chloride: 100 mEq/L (ref 96–112)
Creatinine, Ser: 0.75 mg/dL (ref 0.40–1.50)
GFR: 95.4 mL/min (ref >60.00)
Glucose, Bld: 95 mg/dL (ref 70–99)
Potassium: 4.1 mEq/L (ref 3.5–5.1)
Sodium: 137 mEq/L (ref 135–145)

## 2021-09-16 LAB — LIPID PANEL
Cholesterol: 253 mg/dL — ABNORMAL HIGH (ref 0–200)
HDL: 80 mg/dL (ref >39.00)
Total CHOL/HDL Ratio: 3
Triglycerides: 438 mg/dL — ABNORMAL HIGH (ref 0.0–149.0)

## 2021-09-16 LAB — FERRITIN: Ferritin: 549 ng/mL — ABNORMAL HIGH (ref 22.0–322.0)

## 2021-09-16 LAB — LDL CHOLESTEROL, DIRECT: Direct LDL: 116 mg/dL

## 2021-09-16 NOTE — Telephone Encounter (Signed)
One of his liver tests is elevated. I would like for him to repeat labs as ordered in 2 weeks.

## 2021-09-16 NOTE — Telephone Encounter (Signed)
Patient advised of results and scheduled to come in for labs 09/30/21

## 2021-09-23 ENCOUNTER — Ambulatory Visit: Payer: BC Managed Care – PPO | Admitting: Family

## 2021-09-30 ENCOUNTER — Other Ambulatory Visit: Payer: BC Managed Care – PPO

## 2021-09-30 ENCOUNTER — Ambulatory Visit: Payer: BC Managed Care – PPO | Admitting: Family

## 2021-09-30 VITALS — BP 131/75 | HR 100 | Temp 98.6°F | Resp 16 | Wt 226.0 lb

## 2021-09-30 DIAGNOSIS — I10 Essential (primary) hypertension: Secondary | ICD-10-CM | POA: Diagnosis not present

## 2021-09-30 LAB — BASIC METABOLIC PANEL
BUN: 24 mg/dL — ABNORMAL HIGH (ref 6–23)
CO2: 25 mEq/L (ref 19–32)
Calcium: 8.8 mg/dL (ref 8.4–10.5)
Chloride: 106 mEq/L (ref 96–112)
Creatinine, Ser: 0.8 mg/dL (ref 0.40–1.50)
GFR: 93.53 mL/min (ref >60.00)
Glucose, Bld: 139 mg/dL — ABNORMAL HIGH (ref 70–99)
Potassium: 3.5 mEq/L (ref 3.5–5.1)
Sodium: 141 mEq/L (ref 135–145)

## 2021-09-30 NOTE — Assessment & Plan Note (Signed)
BP Readings from Last 3 Encounters:  09/30/21 131/75  09/15/21 (!) 156/100  06/16/21 (!) 149/76   BP is now at goal. Continue amlodipine '10mg'$  once daily and increased dose of losartan. Obtain follow up bmet.
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
## 2021-10-01 ENCOUNTER — Encounter: Payer: Self-pay | Admitting: Family

## 2021-10-01 NOTE — Progress Notes (Signed)
Mailed out to pt 

## 2021-11-18 ENCOUNTER — Other Ambulatory Visit: Payer: Self-pay | Admitting: Family

## 2021-11-18 DIAGNOSIS — E1169 Type 2 diabetes mellitus with other specified complication: Secondary | ICD-10-CM

## 2021-12-13 ENCOUNTER — Other Ambulatory Visit: Payer: Self-pay | Admitting: Family

## 2021-12-13 DIAGNOSIS — I10 Essential (primary) hypertension: Secondary | ICD-10-CM

## 2022-01-01 ENCOUNTER — Ambulatory Visit: Payer: BC Managed Care – PPO | Admitting: Family

## 2022-01-01 VITALS — BP 134/82 | HR 96 | Temp 98.0°F | Resp 16 | Wt 241.0 lb

## 2022-01-01 DIAGNOSIS — E1142 Type 2 diabetes mellitus with diabetic polyneuropathy: Secondary | ICD-10-CM | POA: Diagnosis not present

## 2022-01-01 DIAGNOSIS — F411 Generalized anxiety disorder: Secondary | ICD-10-CM | POA: Diagnosis not present

## 2022-01-01 DIAGNOSIS — E785 Hyperlipidemia, unspecified: Secondary | ICD-10-CM

## 2022-01-01 DIAGNOSIS — K219 Gastro-esophageal reflux disease without esophagitis: Secondary | ICD-10-CM

## 2022-01-01 DIAGNOSIS — Z23 Encounter for immunization: Secondary | ICD-10-CM

## 2022-01-01 DIAGNOSIS — N529 Male erectile dysfunction, unspecified: Secondary | ICD-10-CM

## 2022-01-01 DIAGNOSIS — R350 Frequency of micturition: Secondary | ICD-10-CM

## 2022-01-01 DIAGNOSIS — E118 Type 2 diabetes mellitus with unspecified complications: Secondary | ICD-10-CM | POA: Diagnosis not present

## 2022-01-01 DIAGNOSIS — I10 Essential (primary) hypertension: Secondary | ICD-10-CM

## 2022-01-01 DIAGNOSIS — F17200 Nicotine dependence, unspecified, uncomplicated: Secondary | ICD-10-CM

## 2022-01-01 LAB — URINALYSIS, ROUTINE W REFLEX MICROSCOPIC
Bilirubin Urine: NEGATIVE
Hgb urine dipstick: NEGATIVE
Ketones, ur: NEGATIVE
Leukocytes,Ua: NEGATIVE
Nitrite: NEGATIVE
Specific Gravity, Urine: 1.02 (ref 1.000–1.030)
Total Protein, Urine: >=300 — AB
Urine Glucose: NEGATIVE
Urobilinogen, UA: 0.2 (ref 0.0–1.0)
pH: 6.5 (ref 5.0–8.0)

## 2022-01-01 LAB — LIPID PANEL
Cholesterol: 175 mg/dL (ref 0–200)
HDL: 90.5 mg/dL (ref >39.00)
LDL Cholesterol: 71 mg/dL (ref 0–99)
NonHDL: 84.86
Total CHOL/HDL Ratio: 2
Triglycerides: 69 mg/dL (ref 0.0–149.0)
VLDL: 13.8 mg/dL (ref 0.0–40.0)

## 2022-01-01 LAB — COMPREHENSIVE METABOLIC PANEL
ALT: 12 U/L (ref 0–53)
AST: 23 U/L (ref 0–37)
Albumin: 4.5 g/dL (ref 3.5–5.2)
Alkaline Phosphatase: 54 U/L (ref 39–117)
BUN: 15 mg/dL (ref 6–23)
CO2: 26 mEq/L (ref 19–32)
Calcium: 9.7 mg/dL (ref 8.4–10.5)
Chloride: 102 mEq/L (ref 96–112)
Creatinine, Ser: 0.71 mg/dL (ref 0.40–1.50)
GFR: 96.79 mL/min (ref >60.00)
Glucose, Bld: 105 mg/dL — ABNORMAL HIGH (ref 70–99)
Potassium: 3.4 mEq/L — ABNORMAL LOW (ref 3.5–5.1)
Sodium: 137 mEq/L (ref 135–145)
Total Bilirubin: 0.8 mg/dL (ref 0.2–1.2)
Total Protein: 7.4 g/dL (ref 6.0–8.3)

## 2022-01-01 LAB — HEMOGLOBIN A1C: Hgb A1c MFr Bld: 5.8 % (ref 4.6–6.5)

## 2022-01-01 NOTE — Assessment & Plan Note (Signed)
Stable without medication  
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
## 2022-01-01 NOTE — Assessment & Plan Note (Signed)
BP Readings from Last 3 Encounters:  01/01/22 (!) 154/80  09/30/21 131/75  09/15/21 (!) 156/100   Continues losartan '100mg'$  once daily. BP elevated.

## 2022-01-01 NOTE — Assessment & Plan Note (Signed)
No significant improvement on revatio- will refer to urology for further management.

## 2022-01-01 NOTE — Assessment & Plan Note (Addendum)
Wt Readings from Last 3 Encounters:  01/01/22 241 lb (109.3 kg)  09/30/21 226 lb (102.5 kg)  09/15/21 222 lb 9.6 oz (101 kg)   Lab Results  Component Value Date   HGBA1C 6.1 09/15/2021   HGBA1C 5.8 05/08/2021   HGBA1C 5.7 05/06/2020   Lab Results  Component Value Date   MICROALBUR 4.8 (H) 06/11/2014   LDLCALC 60 05/08/2021   CREATININE 0.80 09/30/2021   Discussed diet/exercise and weight loss.

## 2022-01-01 NOTE — Assessment & Plan Note (Signed)
Stable without medication. Monitor.  

## 2022-01-01 NOTE — Assessment & Plan Note (Signed)
Stable.  Monitor off medication.

## 2022-01-01 NOTE — Assessment & Plan Note (Signed)
Lab Results  Component Value Date   CHOL 253 (H) 09/15/2021   HDL 80.00 09/15/2021   LDLCALC 60 05/08/2021   LDLDIRECT 116.0 09/15/2021   TRIG (H) 09/15/2021    438.0 Triglyceride is over 400; calculations on Lipids are invalid.   CHOLHDL 3 09/15/2021   Will check FLP, continue fenofibrate and crestor.

## 2022-01-01 NOTE — Assessment & Plan Note (Signed)
Encouraged to quit. He does not seem ready at this time.

## 2022-01-03 ENCOUNTER — Telehealth: Payer: Self-pay | Admitting: Family

## 2022-01-03 LAB — URINE CULTURE
MICRO NUMBER:: 14110836
SPECIMEN QUALITY:: ADEQUATE

## 2022-01-03 MED ORDER — AMOXICILLIN 500 MG PO CAPS: 500.0000 mg | ORAL_CAPSULE | Freq: Three times a day (TID) | ORAL | 0 refills | Status: AC

## 2022-01-03 NOTE — Telephone Encounter (Signed)
Urine culture shows mild UTI.  Rx for amoxicillin sent to [facility] for him to start.

## 2022-01-03 NOTE — Telephone Encounter (Signed)
Also- potassium is a little low.  Please focus on high potassium foods in his diet:   High potassium content foods  Highest content (>25 meq/100 g) High content (>6.2 meq/100 g)   Vegetables   Spinach   Tomatoes   Broccoli   Winter squash   Beets   Carrots   Cauliflower   Potatoes   Fruits   Bananas  Dried figs Cantaloupe  Molasses Kiwis  Seaweed Oranges  Very high content (>12.5 meq/100 g) Mangos  Dried fruits (dates, prunes) Meats  Nuts Ground beef  Avocados Steak  Bran cereals Pork  Wheat germ Veal  Lima beans Lamb

## 2022-01-04 NOTE — Telephone Encounter (Signed)
Lvm for patient to call back

## 2022-01-05 NOTE — Telephone Encounter (Signed)
Patient advised of results and provider's advise

## 2022-03-18 ENCOUNTER — Other Ambulatory Visit: Payer: Self-pay | Admitting: Family

## 2022-03-18 DIAGNOSIS — I10 Essential (primary) hypertension: Secondary | ICD-10-CM

## 2022-03-18 NOTE — Telephone Encounter (Signed)
Please contact pt to schedule follow up.  

## 2022-03-19 NOTE — Telephone Encounter (Signed)
[  T stated he will call back to sched.

## 2022-04-27 IMAGING — US US EXTREM LOW VENOUS*R*
1 series · 14 of 24 positions shown · non-contrast
Comparison: None.

CLINICAL DATA: Right-sided popliteal pain

EXAM:
RIGHT LOWER EXTREMITY VENOUS DOPPLER ULTRASOUND
TECHNIQUE: Gray-scale sonography with compression, as well as color and duplex
ultrasound, were performed to evaluate the deep venous system(s)
from the level of the common femoral vein through the popliteal and
proximal calf veins.

[Series 1: us extrem low venous*right* · 14 of 33 slices shown]
[im 1/33]
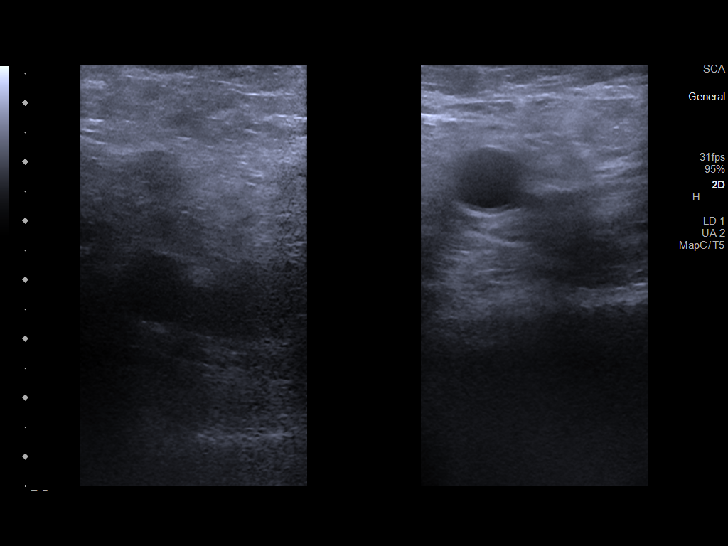
[im 3/33]
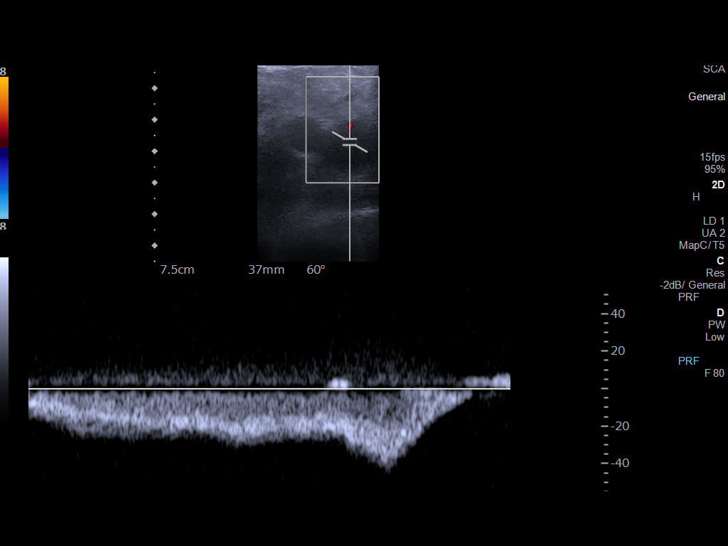
[im 6/33]
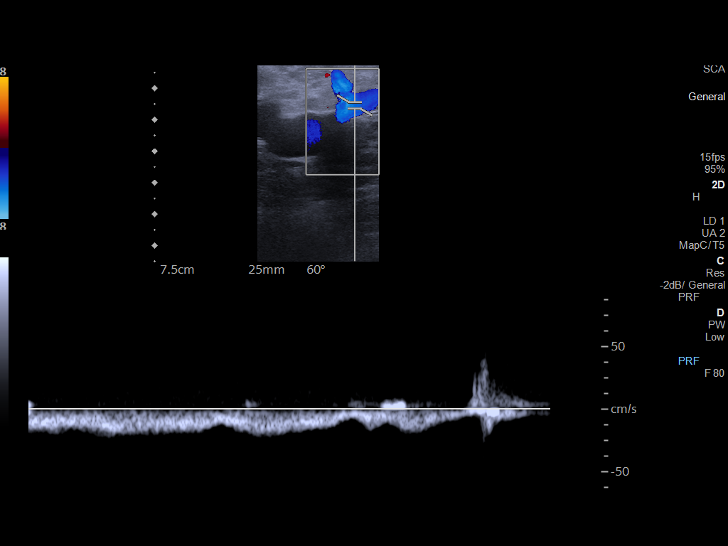
[im 9/33]
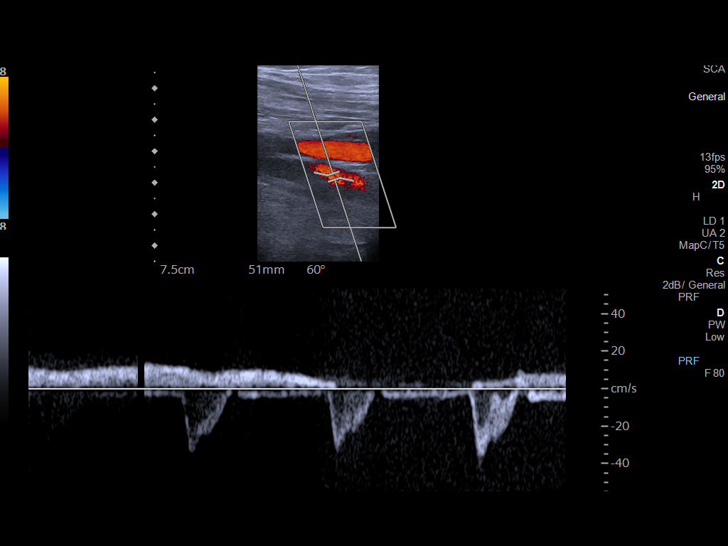
[im 10/33]
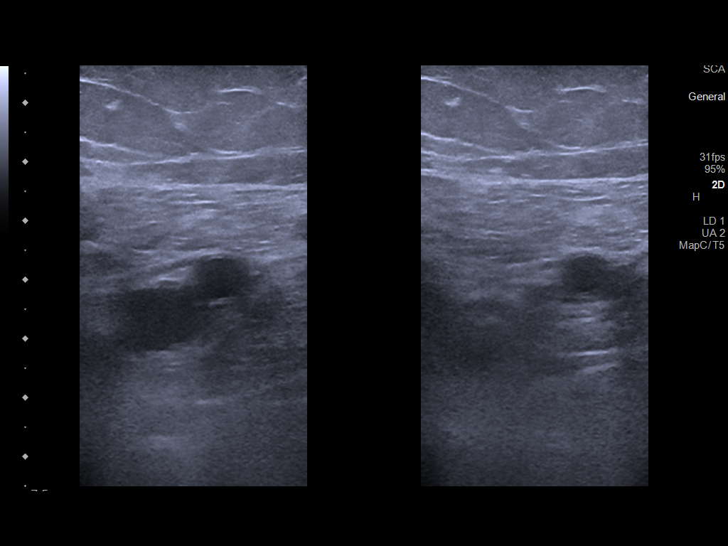
[im 13/33]
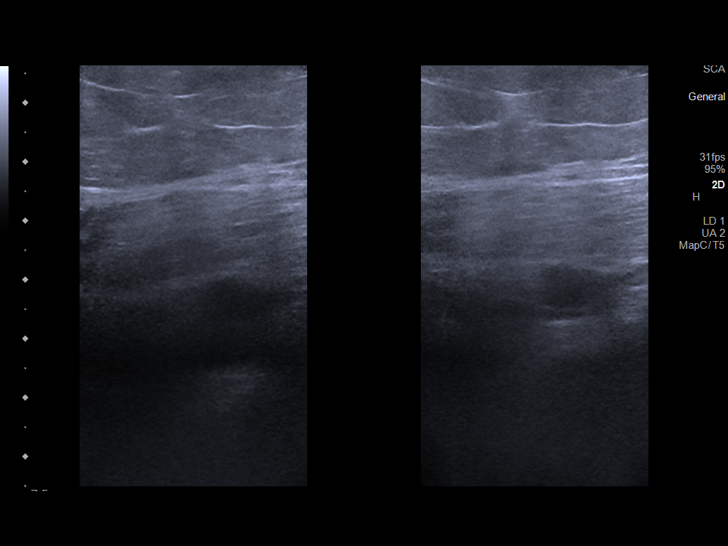
[im 16/33]
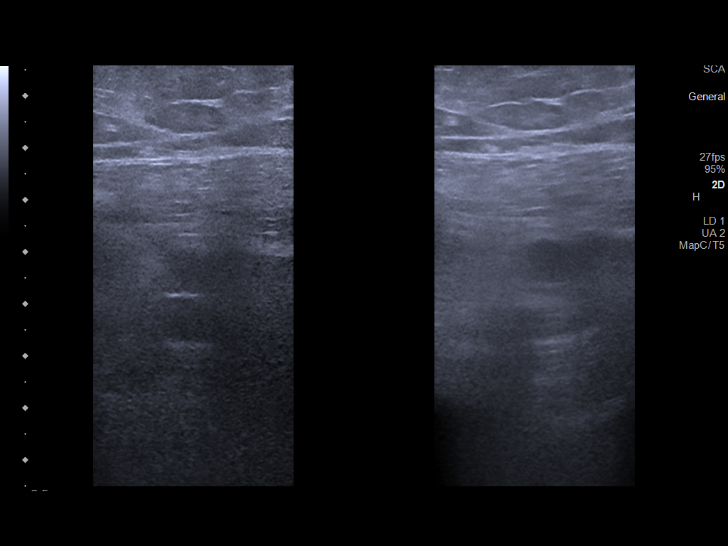
[im 17/33]
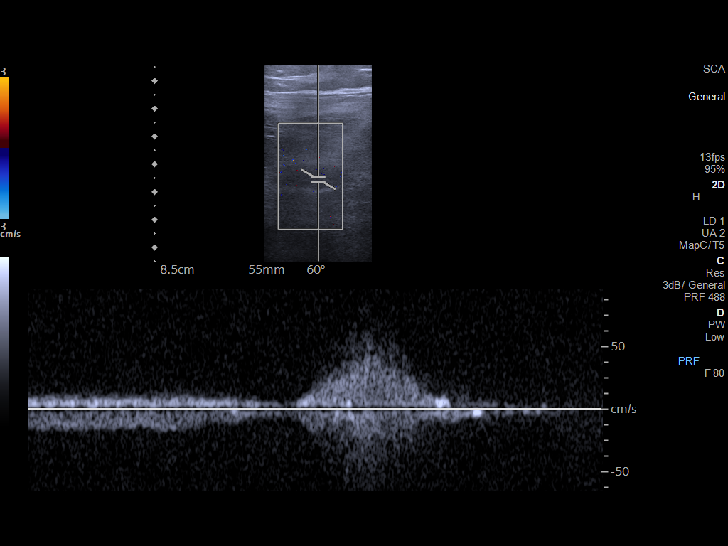
[im 20/33]
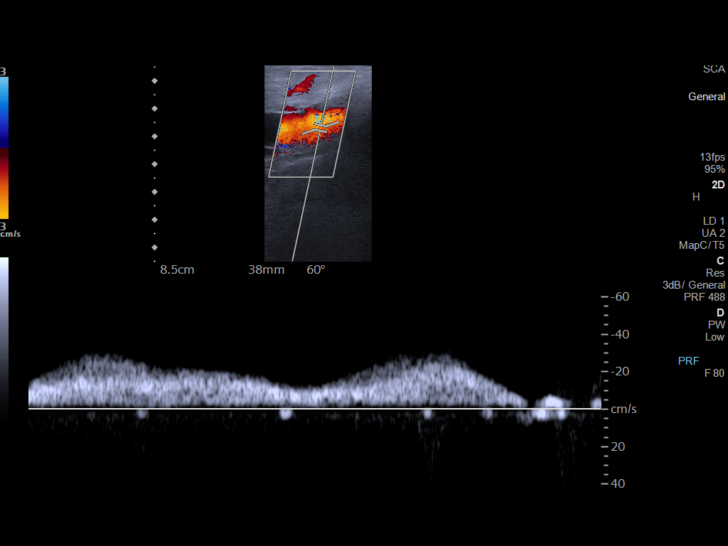
[im 23/33]
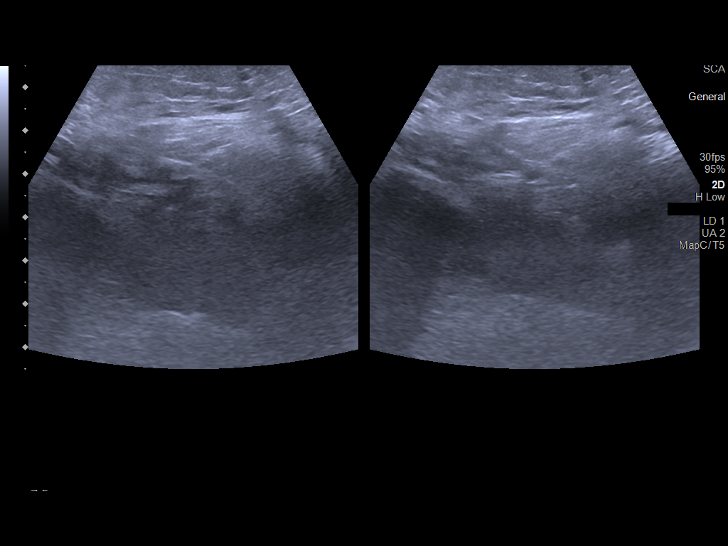
[im 26/33]
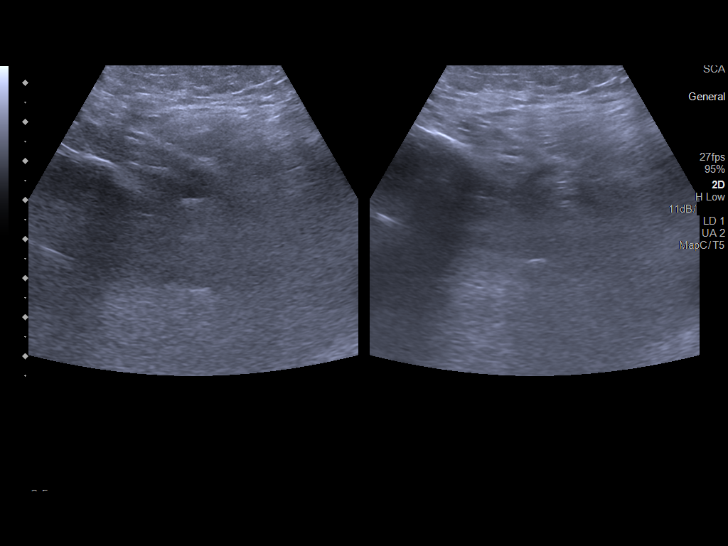
[im 27/33]
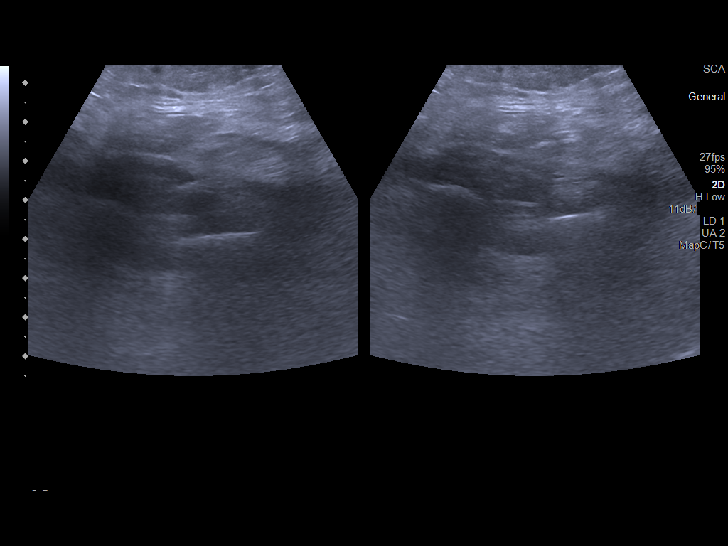
[im 30/33]
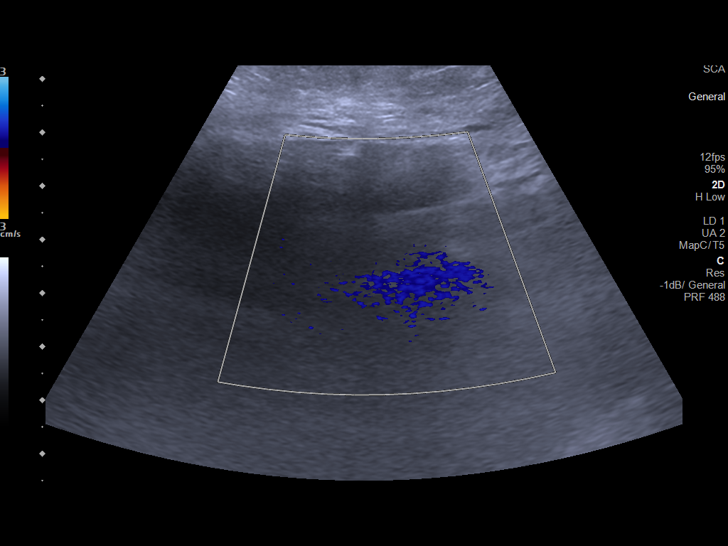
[im 33/33]
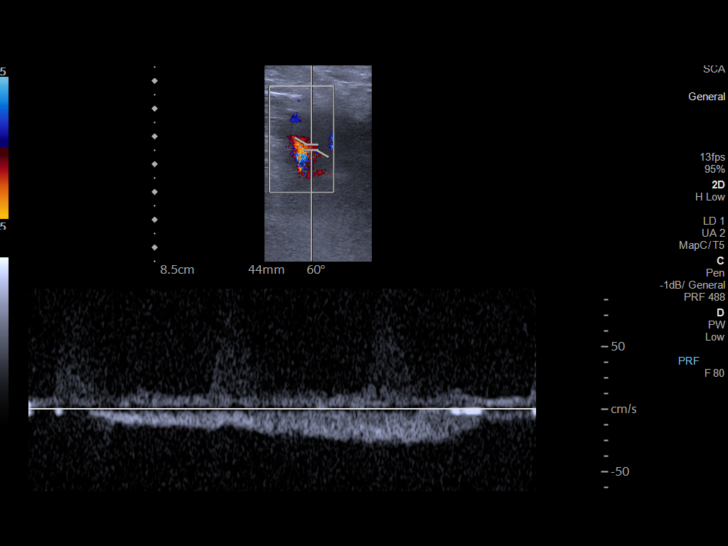

[14 of 24 positions shown; findings below may reference images not displayed]

FINDINGS: VENOUS

Normal compressibility of the common femoral, superficial femoral,
and popliteal veins, as well as the visualized calf veins.
Visualized portions of profunda femoral vein and great saphenous
vein unremarkable. No filling defects to suggest DVT on grayscale or
color Doppler imaging. Doppler waveforms show normal direction of
venous flow, normal respiratory plasticity and response to
augmentation.

Limited views of the contralateral common femoral vein are
unremarkable.

OTHER

None.

Limitations: none
IMPRESSION: Negative.

## 2022-05-27 ENCOUNTER — Encounter: Payer: Self-pay | Admitting: Family

## 2022-05-27 ENCOUNTER — Other Ambulatory Visit: Payer: Self-pay | Admitting: Family

## 2022-05-27 DIAGNOSIS — E1169 Type 2 diabetes mellitus with other specified complication: Secondary | ICD-10-CM

## 2022-05-27 NOTE — Telephone Encounter (Signed)
Can you please schedule him a follow up visit in 1 month?

## 2022-05-27 NOTE — Telephone Encounter (Signed)
Letter mailed to Pt.

## 2022-12-02 ENCOUNTER — Other Ambulatory Visit: Payer: Self-pay | Admitting: Family

## 2022-12-02 DIAGNOSIS — E1169 Type 2 diabetes mellitus with other specified complication: Secondary | ICD-10-CM

## 2022-12-14 ENCOUNTER — Other Ambulatory Visit: Payer: Self-pay | Admitting: Family

## 2022-12-14 ENCOUNTER — Encounter: Payer: Self-pay | Admitting: Family

## 2022-12-14 ENCOUNTER — Ambulatory Visit (INDEPENDENT_AMBULATORY_CARE_PROVIDER_SITE_OTHER): Payer: Medicare Other | Admitting: Family

## 2022-12-14 VITALS — BP 131/78 | HR 107 | Temp 98.4°F | Resp 20 | Ht 68.0 in | Wt 260.4 lb

## 2022-12-14 DIAGNOSIS — D649 Anemia, unspecified: Secondary | ICD-10-CM

## 2022-12-14 DIAGNOSIS — E785 Hyperlipidemia, unspecified: Secondary | ICD-10-CM | POA: Diagnosis not present

## 2022-12-14 DIAGNOSIS — E118 Type 2 diabetes mellitus with unspecified complications: Secondary | ICD-10-CM

## 2022-12-14 DIAGNOSIS — I10 Essential (primary) hypertension: Secondary | ICD-10-CM | POA: Diagnosis not present

## 2022-12-14 DIAGNOSIS — J029 Acute pharyngitis, unspecified: Secondary | ICD-10-CM | POA: Diagnosis not present

## 2022-12-14 DIAGNOSIS — R7989 Other specified abnormal findings of blood chemistry: Secondary | ICD-10-CM | POA: Diagnosis not present

## 2022-12-14 DIAGNOSIS — K219 Gastro-esophageal reflux disease without esophagitis: Secondary | ICD-10-CM

## 2022-12-14 DIAGNOSIS — Z87891 Personal history of nicotine dependence: Secondary | ICD-10-CM | POA: Diagnosis not present

## 2022-12-14 DIAGNOSIS — N529 Male erectile dysfunction, unspecified: Secondary | ICD-10-CM

## 2022-12-14 DIAGNOSIS — Z09 Encounter for follow-up examination after completed treatment for conditions other than malignant neoplasm: Secondary | ICD-10-CM | POA: Diagnosis not present

## 2022-12-14 LAB — CBC WITH DIFFERENTIAL/PLATELET
Basophils Absolute: 0 10*3/uL (ref 0.0–0.1)
Basophils Relative: 0.6 % (ref 0.0–3.0)
Eosinophils Absolute: 0.2 10*3/uL (ref 0.0–0.7)
Eosinophils Relative: 3.2 % (ref 0.0–5.0)
HCT: 33.3 % — ABNORMAL LOW (ref 39.0–52.0)
Hemoglobin: 10.7 g/dL — ABNORMAL LOW (ref 13.0–17.0)
Lymphocytes Relative: 18.9 % (ref 12.0–46.0)
Lymphs Abs: 1.1 10*3/uL (ref 0.7–4.0)
MCHC: 32.1 g/dL (ref 30.0–36.0)
MCV: 103.8 fL — ABNORMAL HIGH (ref 78.0–100.0)
Monocytes Absolute: 0.6 10*3/uL (ref 0.1–1.0)
Monocytes Relative: 10.3 % (ref 3.0–12.0)
Neutro Abs: 3.9 10*3/uL (ref 1.4–7.7)
Neutrophils Relative %: 67 % (ref 43.0–77.0)
Platelets: 275 10*3/uL (ref 150.0–400.0)
RBC: 3.21 Mil/uL — ABNORMAL LOW (ref 4.22–5.81)
RDW: 14.7 % (ref 11.5–15.5)
WBC: 5.8 10*3/uL (ref 4.0–10.5)

## 2022-12-14 LAB — LIPID PANEL
Cholesterol: 175 mg/dL (ref 0–200)
HDL: 90.5 mg/dL (ref >39.00)
LDL Cholesterol: 64 mg/dL (ref 0–99)
NonHDL: 84.46
Total CHOL/HDL Ratio: 2
Triglycerides: 100 mg/dL (ref 0.0–149.0)
VLDL: 20 mg/dL (ref 0.0–40.0)

## 2022-12-14 LAB — B12 AND FOLATE PANEL
Folate: 8.1 ng/mL (ref >5.9)
Vitamin B-12: 341 pg/mL (ref 211–911)

## 2022-12-14 LAB — HEMOGLOBIN A1C: Hgb A1c MFr Bld: 6.5 % (ref 4.6–6.5)

## 2022-12-14 MED ORDER — ROSUVASTATIN CALCIUM 20 MG PO TABS
20.0000 mg | ORAL_TABLET | Freq: Every day | ORAL | 1 refills | Status: DC
Start: 2022-12-14 — End: 2023-06-08

## 2022-12-14 MED ORDER — FENOFIBRATE 145 MG PO TABS: ORAL_TABLET | ORAL | 1 refills | Status: DC

## 2022-12-14 MED ORDER — SILDENAFIL CITRATE 20 MG PO TABS: ORAL_TABLET | ORAL | 0 refills | Status: AC

## 2022-12-14 MED ORDER — OMEPRAZOLE 20 MG PO CPDR
20.0000 mg | DELAYED_RELEASE_CAPSULE | Freq: Every day | ORAL | 3 refills | Status: DC
Start: 2022-12-14 — End: 2023-12-06

## 2022-12-14 MED ORDER — LOSARTAN POTASSIUM 100 MG PO TABS: 100.0000 mg | ORAL_TABLET | Freq: Every day | ORAL | 1 refills | Status: DC

## 2022-12-14 NOTE — Assessment & Plan Note (Signed)
BP Readings from Last 3 Encounters:  12/14/22 131/78  01/01/22 134/82  09/30/21 131/75   At goal, continue losartan and amlodipine.

## 2022-12-14 NOTE — Assessment & Plan Note (Signed)
Lab Results  Component Value Date   WBC 3.6 (L) 12/27/2017   HGB 14.2 12/27/2017   HCT 42.0 12/27/2017   MCV 103.9 (H) 12/27/2017   PLT 216.0 12/27/2017   Update cbc, check b12 due to macrocytosis.

## 2022-12-14 NOTE — Assessment & Plan Note (Signed)
Lab Results  Component Value Date   HGBA1C 5.8 01/01/2022   HGBA1C 6.1 09/15/2021   HGBA1C 5.8 05/08/2021   Lab Results  Component Value Date   MICROALBUR 4.8 (H) 06/11/2014   LDLCALC 71 01/01/2022   CREATININE 0.71 01/01/2022   Wt Readings from Last 3 Encounters:  12/14/22 260 lb 6.4 oz (118.1 kg)  01/01/22 241 lb (109.3 kg)  09/30/21 226 lb (102.5 kg)

## 2022-12-14 NOTE — Assessment & Plan Note (Signed)
Lab Results  Component Value Date   CHOL 175 01/01/2022   HDL 90.50 01/01/2022   LDLCALC 71 01/01/2022   LDLDIRECT 116.0 09/15/2021   TRIG 69.0 01/01/2022   CHOLHDL 2 01/01/2022   Continue fenofibrate/crestor, update lipid panel.

## 2022-12-14 NOTE — Patient Instructions (Signed)
VISIT SUMMARY:  During your visit today, we discussed your recent weight gain and neck discomfort. We also reviewed your ongoing conditions including hyperlipidemia, hypertension, and erectile dysfunction. You've been proactive about your health, having quit smoking two months ago and maintaining regular exercise and portion control. We also noted your recent vaccinations and your plans for an eye exam.  YOUR PLAN:  -TOBACCO USE: You've successfully quit smoking for over two months. It's important to continue avoiding tobacco to maintain your health.  -WEIGHT GAIN: You've noticed weight gain since quitting smoking. Continue your regular walking routine and eating small portions to help manage your weight.  -HYPERLIPIDEMIA: Hyperlipidemia is a condition where there are high levels of fats in your blood. We'll check your cholesterol levels today to ensure your medications are working effectively.  -NECK DISCOMFORT: You've reported a sensation of swelling in your neck. the area of concern appears to be fat pads which are likely more noticeable due to your recent weight gain. We'll examine your neck and throat today and consider a trial of omeprazole, a medication that can help with acid-related throat symptoms.  -ERECTILE DYSFUNCTION: You've reported that your current medication for erectile dysfunction is not very effective. We'll refer you to a Urology specialist for further management options and refill your prescription for generic Viagra.  -FOOT DISCOMFORT: You've reported occasional discomfort in your feet. We'll examine your feet today to ensure there are no issues.  -DIABETES: You've been managing your diabetes well. However, due to your recent weight gain, we'll check your HbA1c levels today to ensure your blood glucose levels are still under control.  INSTRUCTIONS:  We'll administer the pneumonia vaccine today. Please continue to schedule your eye exam and share the results with our  office. We'll also be checking your cholesterol and HbA1c levels today, and examining your neck, throat, and feet.
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
## 2022-12-14 NOTE — Assessment & Plan Note (Addendum)
He notes some throat scratchiness.  Possibly due to gerd. Will give trial of omeprazole. He is advised to let me know if symptoms are not improved in 1 month.

## 2022-12-14 NOTE — Assessment & Plan Note (Signed)
Will retry sildenafil. If no improvement then we discussed urology referral.

## 2022-12-14 NOTE — Assessment & Plan Note (Signed)
Commended pt on quitting.

## 2022-12-15 ENCOUNTER — Encounter: Payer: Self-pay | Admitting: Family

## 2022-12-15 ENCOUNTER — Telehealth: Payer: Self-pay | Admitting: Family

## 2022-12-15 DIAGNOSIS — D649 Anemia, unspecified: Secondary | ICD-10-CM

## 2022-12-15 LAB — MICROALBUMIN / CREATININE URINE RATIO
Creatinine,U: 79.3 mg/dL
Microalb Creat Ratio: 86.8 mg/g — ABNORMAL HIGH (ref 0.0–30.0)
Microalb, Ur: 68.9 mg/dL — ABNORMAL HIGH (ref 0.0–1.9)

## 2022-12-15 LAB — IRON,TIBC AND FERRITIN PANEL
%SAT: 14 % — ABNORMAL LOW (ref 20–48)
Ferritin: 426 ng/mL — ABNORMAL HIGH (ref 24–380)
Iron: 54 ug/dL (ref 50–180)
TIBC: 387 ug/dL (ref 250–425)

## 2022-12-15 MED ORDER — VITAMIN B-12 1000 MCG PO TABS: 1000.0000 ug | ORAL_TABLET | Freq: Every day | ORAL | Status: AC

## 2022-12-15 NOTE — Telephone Encounter (Addendum)
Please advise pt that he is anemic.  I would like him to add b12 1000 mcg PO daily over the counter.  I would also like for him to complete an IFOB.  Cholesterol is at goal on crestor/fenofibrate- continue same.  There is protein in his urine, continue losartan for kidney protection.   Sugar is in the diabetes range. Please continue work on healthy diet, exercise and weight loss.  Please keep upcoming appointment in January.

## 2022-12-16 NOTE — Telephone Encounter (Signed)
Called patient but no answer, left voice mail for patient to call back.   

## 2022-12-20 NOTE — Telephone Encounter (Signed)
Patient notified of results, new B12 supplement and provider's comments and recommendations. He will have wife pick up IFOB

## 2023-03-07 ENCOUNTER — Telehealth: Payer: Self-pay | Admitting: Family

## 2023-03-07 NOTE — Telephone Encounter (Signed)
Copied from CRM [id number]. Topic: Medicare AWV >> Mar 07, 2023 11:49 AM Payton Doughty wrote: Reason for CRM: Called LVM 03/07/2023 to schedule AWV. Please schedule Virtual or Telehealth visits ONLY.   Verlee Rossetti; Care Guide Ambulatory Clinical Support [facility], [facility] Direct Dial: [phone]

## 2023-03-18 ENCOUNTER — Ambulatory Visit: Payer: Medicare Other | Admitting: Family

## 2023-05-31 DIAGNOSIS — H5203 Hypermetropia, bilateral: Secondary | ICD-10-CM | POA: Diagnosis not present

## 2023-05-31 DIAGNOSIS — H524 Presbyopia: Secondary | ICD-10-CM | POA: Diagnosis not present

## 2023-05-31 DIAGNOSIS — E119 Type 2 diabetes mellitus without complications: Secondary | ICD-10-CM | POA: Diagnosis not present

## 2023-05-31 DIAGNOSIS — H52223 Regular astigmatism, bilateral: Secondary | ICD-10-CM | POA: Diagnosis not present

## 2023-05-31 DIAGNOSIS — H2513 Age-related nuclear cataract, bilateral: Secondary | ICD-10-CM | POA: Diagnosis not present

## 2023-05-31 LAB — HM DIABETES EYE EXAM

## 2023-06-08 ENCOUNTER — Other Ambulatory Visit: Payer: Self-pay | Admitting: Family

## 2023-06-08 DIAGNOSIS — E1169 Type 2 diabetes mellitus with other specified complication: Secondary | ICD-10-CM

## 2023-06-08 DIAGNOSIS — E785 Hyperlipidemia, unspecified: Secondary | ICD-10-CM

## 2023-10-12 NOTE — Progress Notes (Signed)
 [facility] Quality Team Note  Name: Drew Harris Date of Birth: [birth year] MRN: [id number] Date: 10/12/2023  [facility] Quality Team has reviewed this patient's chart, please see recommendations below:  [facility] Quality Other; (CHART REVIEWED FOR KED AND GSD. NO VALUES IN 2025. NO UPCOMING APPTS.)

## 2023-12-02 ENCOUNTER — Ambulatory Visit (INDEPENDENT_AMBULATORY_CARE_PROVIDER_SITE_OTHER): Admitting: Family

## 2023-12-02 ENCOUNTER — Telehealth: Payer: Self-pay | Admitting: Family

## 2023-12-02 ENCOUNTER — Encounter: Payer: Self-pay | Admitting: Family

## 2023-12-02 VITALS — BP 127/62 | HR 88 | Temp 99.0°F | Resp 16 | Ht 68.0 in | Wt 262.0 lb

## 2023-12-02 DIAGNOSIS — Z23 Encounter for immunization: Secondary | ICD-10-CM | POA: Diagnosis not present

## 2023-12-02 DIAGNOSIS — Z7985 Long-term (current) use of injectable non-insulin antidiabetic drugs: Secondary | ICD-10-CM | POA: Diagnosis not present

## 2023-12-02 DIAGNOSIS — D649 Anemia, unspecified: Secondary | ICD-10-CM | POA: Diagnosis not present

## 2023-12-02 DIAGNOSIS — K219 Gastro-esophageal reflux disease without esophagitis: Secondary | ICD-10-CM

## 2023-12-02 DIAGNOSIS — I10 Essential (primary) hypertension: Secondary | ICD-10-CM | POA: Diagnosis not present

## 2023-12-02 DIAGNOSIS — E118 Type 2 diabetes mellitus with unspecified complications: Secondary | ICD-10-CM

## 2023-12-02 DIAGNOSIS — E785 Hyperlipidemia, unspecified: Secondary | ICD-10-CM

## 2023-12-02 DIAGNOSIS — E1142 Type 2 diabetes mellitus with diabetic polyneuropathy: Secondary | ICD-10-CM | POA: Diagnosis not present

## 2023-12-02 DIAGNOSIS — Z0000 Encounter for general adult medical examination without abnormal findings: Secondary | ICD-10-CM

## 2023-12-02 MED ORDER — TIRZEPATIDE 2.5 MG/0.5ML ~~LOC~~ SOAJ: 2.5000 mg | SUBCUTANEOUS | 2 refills | Status: AC

## 2023-12-02 NOTE — Assessment & Plan Note (Signed)
 BP Readings from Last 3 Encounters:  12/02/23 127/62  12/14/22 131/78  01/01/22 134/82   BP stable on losartan  and amlodipine , continue same
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
## 2023-12-02 NOTE — Assessment & Plan Note (Signed)
 Lab Results  Component Value Date   WBC 5.8 12/14/2022   HGB 10.7 (L) 12/14/2022   HCT 33.3 (L) 12/14/2022   MCV 103.8 (H) 12/14/2022   PLT 275.0 12/14/2022

## 2023-12-02 NOTE — Assessment & Plan Note (Signed)
 Not bothering him currently.

## 2023-12-02 NOTE — Assessment & Plan Note (Signed)
 Lab Results  Component Value Date   CHOL 175 12/14/2022   HDL 90.50 12/14/2022   LDLCALC 64 12/14/2022   LDLDIRECT 116.0 09/15/2021   TRIG 100.0 12/14/2022   CHOLHDL 2 12/14/2022   Tolerating crestor /tricor /fish oil. Update lipid panel.

## 2023-12-02 NOTE — Assessment & Plan Note (Signed)
 Lab Results  Component Value Date   HGBA1C 6.5 12/14/2022   HGBA1C 5.8 01/01/2022   HGBA1C 6.1 09/15/2021   Lab Results  Component Value Date   MICROALBUR 4.8 (H) 06/11/2014   LDLCALC 64 12/14/2022   CREATININE 0.71 01/01/2022   Update A1c,  will initiate mounjaro  to help with weight loss.

## 2023-12-02 NOTE — Telephone Encounter (Signed)
 Please request DM eye exam from [facility], Dr. Debarah.

## 2023-12-02 NOTE — Assessment & Plan Note (Signed)
 Overall stable on omeprazole  20mg  once daily.
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
## 2023-12-05 NOTE — Telephone Encounter (Signed)
 Electronic request sent

## 2023-12-05 NOTE — Patient Instructions (Signed)
 VISIT SUMMARY:  During your visit, we discussed your recent weight gain and fatigue, and reviewed your management plan for diabetes, hypertension, hyperlipidemia, GERD, anemia, and vitamin B12 deficiency. We also talked about general health maintenance, including vaccinations and insurance coverage for medications.  YOUR PLAN:  ADULT WELLNESS VISIT: Routine wellness visit with weight gain of 21 pounds over two years and associated fatigue. -Continue walking and swimming for exercise. -Consider dietary modifications to aid weight loss.  OBESITY: Obesity with recent weight gain. Discussed GLP-1 receptor agonist injections for weight loss, including mechanism, side effects, and cost. -Attempt to obtain insurance approval for GLP-1 receptor agonist injections. -Discuss cost and insurance options with pharmacy. -Continue physical activity and dietary modifications.  TYPE 2 DIABETES MELLITUS: Type 2 diabetes mellitus with suboptimal follow-up. Emphasized regular monitoring and A1c checks every three months. -Schedule follow-up every three months for diabetes management. -Order A1c test with next lab work.  ESSENTIAL HYPERTENSION: Hypertension well-controlled on amlodipine  and losartan . -Continue current medications as prescribed.  HYPERLIPIDEMIA: Hyperlipidemia managed with Crestor , Tricor , and fish oil. -Continue current medications as prescribed.  GASTROESOPHAGEAL REFLUX DISEASE: GERD managed with omeprazole . Symptoms occur occasionally. -Continue omeprazole  as prescribed. -You can take omeprazole  with or without food.  ANEMIA, UNSPECIFIED: Anemia with previous low blood count. Discussed potential GI blood loss and fecal occult blood test. -Order complete blood count with next lab work. -Provide fecal occult blood test kit for GI bleeding assessment.  VITAMIN B12 DEFICIENCY: Vitamin B12 deficiency with daily supplements. -Order B12 level with next lab work.  GENERAL HEALTH  MAINTENANCE: Received flu shot. Discussed COVID booster and insurance coverage for medications. -Encourage COVID booster vaccination at pharmacy. -Discuss insurance plan options with agent for better medication coverage.  FOLLOW-UP: Plan for follow-up appointments and lab work scheduling. -Schedule lab appointment for next week. -Schedule follow-up appointment three months after lab work.

## 2023-12-06 ENCOUNTER — Other Ambulatory Visit (INDEPENDENT_AMBULATORY_CARE_PROVIDER_SITE_OTHER)

## 2023-12-06 ENCOUNTER — Other Ambulatory Visit: Payer: Self-pay

## 2023-12-06 DIAGNOSIS — E118 Type 2 diabetes mellitus with unspecified complications: Secondary | ICD-10-CM | POA: Diagnosis not present

## 2023-12-06 DIAGNOSIS — D649 Anemia, unspecified: Secondary | ICD-10-CM

## 2023-12-06 DIAGNOSIS — E785 Hyperlipidemia, unspecified: Secondary | ICD-10-CM

## 2023-12-06 DIAGNOSIS — I10 Essential (primary) hypertension: Secondary | ICD-10-CM

## 2023-12-06 DIAGNOSIS — J029 Acute pharyngitis, unspecified: Secondary | ICD-10-CM

## 2023-12-06 LAB — COMPREHENSIVE METABOLIC PANEL WITH GFR
ALT: 8 U/L (ref 0–53)
AST: 19 U/L (ref 0–37)
Albumin: 4.4 g/dL (ref 3.5–5.2)
Alkaline Phosphatase: 43 U/L (ref 39–117)
BUN: 31 mg/dL — ABNORMAL HIGH (ref 6–23)
CO2: 24 meq/L (ref 19–32)
Calcium: 9.7 mg/dL (ref 8.4–10.5)
Chloride: 104 meq/L (ref 96–112)
Creatinine, Ser: 1.6 mg/dL — ABNORMAL HIGH (ref 0.40–1.50)
GFR: 44.57 mL/min — ABNORMAL LOW (ref >60.00)
Glucose, Bld: 96 mg/dL (ref 70–99)
Potassium: 4.7 meq/L (ref 3.5–5.1)
Sodium: 138 meq/L (ref 135–145)
Total Bilirubin: 0.3 mg/dL (ref 0.2–1.2)
Total Protein: 7.5 g/dL (ref 6.0–8.3)

## 2023-12-06 LAB — CBC WITH DIFFERENTIAL/PLATELET
Basophils Absolute: 0 K/uL (ref 0.0–0.1)
Basophils Relative: 0.8 % (ref 0.0–3.0)
Eosinophils Absolute: 0.1 K/uL (ref 0.0–0.7)
Eosinophils Relative: 2.7 % (ref 0.0–5.0)
HCT: 27.4 % — ABNORMAL LOW (ref 39.0–52.0)
Hemoglobin: 8.9 g/dL — ABNORMAL LOW (ref 13.0–17.0)
Lymphocytes Relative: 26.4 % (ref 12.0–46.0)
Lymphs Abs: 1.1 K/uL (ref 0.7–4.0)
MCHC: 32.4 g/dL (ref 30.0–36.0)
MCV: 97.8 fl (ref 78.0–100.0)
Monocytes Absolute: 0.6 K/uL (ref 0.1–1.0)
Monocytes Relative: 14.5 % — ABNORMAL HIGH (ref 3.0–12.0)
Neutro Abs: 2.4 K/uL (ref 1.4–7.7)
Neutrophils Relative %: 55.6 % (ref 43.0–77.0)
Platelets: 346 K/uL (ref 150.0–400.0)
RBC: 2.8 Mil/uL — ABNORMAL LOW (ref 4.22–5.81)
RDW: 15.5 % (ref 11.5–15.5)
WBC: 4.3 K/uL (ref 4.0–10.5)

## 2023-12-06 LAB — LIPID PANEL
Cholesterol: 139 mg/dL (ref 0–200)
HDL: 53.3 mg/dL (ref >39.00)
LDL Cholesterol: 71 mg/dL (ref 0–99)
NonHDL: 85.41
Total CHOL/HDL Ratio: 3
Triglycerides: 74 mg/dL (ref 0.0–149.0)
VLDL: 14.8 mg/dL (ref 0.0–40.0)

## 2023-12-06 LAB — MICROALBUMIN / CREATININE URINE RATIO
Creatinine,U: 146.2 mg/dL
Microalb Creat Ratio: 475.1 mg/g — ABNORMAL HIGH (ref 0.0–30.0)
Microalb, Ur: 69.5 mg/dL — ABNORMAL HIGH (ref 0.0–1.9)

## 2023-12-06 LAB — FOLATE: Folate: 7.6 ng/mL (ref >5.9)

## 2023-12-06 LAB — IRON,TIBC AND FERRITIN PANEL
%SAT: 10 % — ABNORMAL LOW (ref 20–48)
Ferritin: 255 ng/mL (ref 24–380)
Iron: 42 ug/dL — ABNORMAL LOW (ref 50–180)
TIBC: 411 ug/dL (ref 250–425)

## 2023-12-06 LAB — VITAMIN B12: Vitamin B-12: >1500 pg/mL — ABNORMAL HIGH (ref 211–911)

## 2023-12-06 MED ORDER — ROSUVASTATIN CALCIUM 20 MG PO TABS
20.0000 mg | ORAL_TABLET | Freq: Every day | ORAL | 1 refills | Status: AC
Start: 2023-12-06 — End: ?

## 2023-12-06 MED ORDER — OMEPRAZOLE 20 MG PO CPDR: 20.0000 mg | DELAYED_RELEASE_CAPSULE | Freq: Every day | ORAL | 3 refills | Status: DC

## 2023-12-06 MED ORDER — LOSARTAN POTASSIUM 100 MG PO TABS
100.0000 mg | ORAL_TABLET | Freq: Every day | ORAL | 1 refills | Status: AC
Start: 2023-12-06 — End: ?

## 2023-12-07 ENCOUNTER — Encounter: Payer: Self-pay | Admitting: *Deleted

## 2023-12-08 ENCOUNTER — Telehealth: Payer: Self-pay

## 2023-12-08 ENCOUNTER — Ambulatory Visit: Payer: Self-pay | Admitting: Family

## 2023-12-08 DIAGNOSIS — N289 Disorder of kidney and ureter, unspecified: Secondary | ICD-10-CM

## 2023-12-08 DIAGNOSIS — D509 Iron deficiency anemia, unspecified: Secondary | ICD-10-CM

## 2023-12-08 MED ORDER — IRON (FERROUS SULFATE) 325 (65 FE) MG PO TABS: 325.0000 mg | ORAL_TABLET | ORAL | Status: AC

## 2023-12-08 NOTE — Telephone Encounter (Signed)
 Called pt to relay results but received no answer voicemail left for pt to call back  Copied from CRM [id number]. Topic: Clinical - Lab/Test Results >> Dec 08, 2023  1:58 PM Drew Harris wrote: Reason for CRM: Patient returning call from [place], regarding his lab results. Called [facility] advised gone for the day. Advised patient will return call tomorrow.

## 2023-12-08 NOTE — Progress Notes (Signed)
 Called patient but no answer, left voice mail for patient to call back.

## 2023-12-08 NOTE — Telephone Encounter (Signed)
 Please advise pt that his kidney function has decreased significantly and he is quite anemic.  I would like to refer him to a kidney doctor and I would like him to complete and return the IFOB as soon as possible because I need to know if he is bleeding in his GI tract- if so, we need to get him back in to see GI.   Has he been taking any ibuprofen/aleve? If so, please stop as these medications are not good for his kidneys.   For anemia- add iron 325mg  one tab every other day and I am going to refer him upstairs to hematology.   I would like to see him back in 1 month.

## 2023-12-15 ENCOUNTER — Telehealth: Payer: Self-pay | Admitting: Family

## 2023-12-15 NOTE — Telephone Encounter (Signed)
 Please call patient and remind him to complete his IFOB. He is very anemic.  If he is unable to complete IFOB, please bring him back for office visit so I can do a hemoccult in office.

## 2023-12-15 NOTE — Telephone Encounter (Signed)
 Called patient but no answer, left voice mail for patient to call back.

## 2023-12-16 ENCOUNTER — Other Ambulatory Visit: Payer: Self-pay

## 2023-12-16 ENCOUNTER — Emergency Department (HOSPITAL_BASED_OUTPATIENT_CLINIC_OR_DEPARTMENT_OTHER)

## 2023-12-16 ENCOUNTER — Emergency Department (HOSPITAL_BASED_OUTPATIENT_CLINIC_OR_DEPARTMENT_OTHER)
Admission: EM | Admit: 2023-12-16 | Discharge: 2023-12-16 | Disposition: A | Discharge status: Home / Self Care | Attending: Emergency Medicine | Admitting: Emergency Medicine

## 2023-12-16 DIAGNOSIS — Z7982 Long term (current) use of aspirin: Secondary | ICD-10-CM | POA: Diagnosis not present

## 2023-12-16 DIAGNOSIS — M19041 Primary osteoarthritis, right hand: Secondary | ICD-10-CM | POA: Diagnosis not present

## 2023-12-16 DIAGNOSIS — M79644 Pain in right finger(s): Secondary | ICD-10-CM | POA: Insufficient documentation

## 2023-12-16 DIAGNOSIS — M7989 Other specified soft tissue disorders: Secondary | ICD-10-CM | POA: Insufficient documentation

## 2023-12-16 DIAGNOSIS — R2231 Localized swelling, mass and lump, right upper limb: Secondary | ICD-10-CM | POA: Diagnosis not present

## 2023-12-16 MED ORDER — METHYLPREDNISOLONE 4 MG PO TBPK: ORAL_TABLET | ORAL | 0 refills | Status: DC

## 2023-12-16 MED ORDER — CEPHALEXIN 500 MG PO CAPS: 500.0000 mg | ORAL_CAPSULE | Freq: Four times a day (QID) | ORAL | 0 refills | Status: DC

## 2023-12-16 MED ORDER — DICLOFENAC SODIUM 1 % EX GEL: 4.0000 g | Freq: Four times a day (QID) | CUTANEOUS | 0 refills | Status: AC

## 2023-12-16 NOTE — ED Notes (Signed)

## 2023-12-16 NOTE — ED Triage Notes (Signed)
 Pt reports intermittent swelling in middle finger of right hand over past 2 months. Denies history of gout. Current episode started a few days ago. Mild improvement in pain with tylenol.

## 2023-12-16 NOTE — Discharge Instructions (Addendum)
 Your x-ray looks okay.  I have started you on anti-inflammatory medications and antibiotics.  Please call your family doctor on Monday and let them know about your visit.  I have also given you information to follow-up with a hand [place] in clinic.  Since this is a recurrent issue he may want to look into ligamentous injury to that joint.  Please return for rapid swelling or if you develop a fever.  Your family doctor is worried that you might be bleeding.  Please return for dark stool or blood in your stool or if you develop shortness of breath or lightheadedness or feel he might pass out.

## 2023-12-16 NOTE — ED Provider Notes (Signed)
 [facility] Provider Note   CSN: [id number] Arrival date & time: 12/16/23  1510     Patient presents with: Hand Pain (Middle finger on right hand is swollen)   Drew Harris is a 68 y.o. male.   68 yo M with a chief complaints of right middle finger pain and swelling.  Started yesterday.  Was significantly more swollen then.  He took some Tylenol and feels like it got a bit better.  He has actually had this pain off and on, this would be the third time that it occurred in the same area.  Denies history of gout.  Denies swelling or pain to joints otherwise.  He denies injury to the area.  Denies repetitive work that might involve his hands.  Denies fevers.   Hand Pain       Prior to Admission medications   Medication Sig Start Date End Date Taking? Authorizing Provider  cephALEXin (KEFLEX) 500 MG capsule Take 1 capsule (500 mg total) by mouth 4 (four) times daily. 12/16/23  Yes Emil Share, DO  diclofenac  Sodium (VOLTAREN ) 1 % GEL Apply 4 g topically 4 (four) times daily. 12/16/23  Yes Macky Galik, DO  methylPREDNISolone (MEDROL DOSEPAK) 4 MG TBPK tablet Day 1: 8mg  before breakfast, 4 mg after lunch, 4 mg after supper, and 8 mg at bedtime Day 2: 4 mg before breakfast, 4 mg after lunch, 4 mg  after supper, and 8 mg  at bedtime Day 3:  4 mg  before breakfast, 4 mg  after lunch, 4 mg after supper, and 4 mg  at bedtime Day 4: 4 mg  before breakfast, 4 mg  after lunch, and 4 mg at bedtime Day 5: 4 mg  before breakfast and 4 mg at bedtime Day 6: 4 mg  before breakfast 12/16/23  Yes Heba Ige, DO  amLODipine  (NORVASC ) 10 MG tablet TAKE 1 TABLET BY MOUTH EVERY DAY 06/08/23   O'Sullivan, Melissa, NP  aspirin EC 81 MG tablet Take 81 mg by mouth daily.    [provider]  cyanocobalamin  (VITAMIN B12) 1000 MCG tablet Take 1 tablet (1,000 mcg total) by mouth daily. 12/15/22   O'Sullivan, Melissa, NP  fenofibrate  (TRICOR ) 145 MG tablet TAKE 1  TABLET BY MOUTH EVERY DAY *NEED APPOINTMENT FOR REFILL 06/08/23   O'Sullivan, Melissa, NP  Iron, Ferrous Sulfate, 325 (65 Fe) MG TABS Take 325 mg by mouth every other day. 12/08/23   O'Sullivan, Melissa, NP  losartan  (COZAAR ) 100 MG tablet Take 1 tablet (100 mg total) by mouth daily. 12/06/23   O'Sullivan, Melissa, NP  Omega-3 Fatty Acids (FISH OIL) 1000 MG CAPS Take 2,000 mg by mouth 2 (two) times daily. Reported on 05/05/2015    [provider]  omeprazole  (PRILOSEC) 20 MG capsule Take 1 capsule (20 mg total) by mouth daily. 12/06/23   O'Sullivan, Melissa, NP  rosuvastatin  (CRESTOR ) 20 MG tablet Take 1 tablet (20 mg total) by mouth daily. 12/06/23   O'Sullivan, Melissa, NP  sildenafil  (REVATIO ) 20 MG tablet TAKE ONE TO TWO TABLETS BY MOUTH ONE HOUR PRIOR TO SEXUAL ACTIVITY DAILY AS NEEDED 12/14/22   O'Sullivan, Melissa, NP  tirzepatide  (MOUNJARO ) 2.5 MG/0.5ML Pen Inject 2.5 mg into the skin once a week. 12/02/23   O'Sullivan, Melissa, NP    Allergies: Ace inhibitors and Atorvastatin     Review of Systems  Updated Vital Signs BP (!) 155/97 (BP Location: Left Arm)   Pulse (!) 115   Temp  98.1 F (36.7 C) (Oral)   Resp 16   Ht 5' 8 (1.727 m)   Wt 117.9 kg   SpO2 99%   BMI 39.53 kg/m   Physical Exam Vitals and nursing note reviewed.  Constitutional:      Appearance: He is well-developed.  HENT:     Head: Normocephalic and atraumatic.  Eyes:     Pupils: Pupils are equal, round, and reactive to light.  Neck:     Vascular: No JVD.  Cardiovascular:     Rate and Rhythm: Normal rate and regular rhythm.     Heart sounds: No murmur heard.    No friction rub. No gallop.  Pulmonary:     Effort: No respiratory distress.     Breath sounds: No wheezing.  Abdominal:     General: There is no distension.     Tenderness: There is no abdominal tenderness. There is no guarding or rebound.  Musculoskeletal:        General: Swelling and tenderness present. Normal range of motion.     Cervical  back: Normal range of motion and neck supple.     Comments: Pain and swelling about the right third finger at the PIP.  Pain with range of motion.  Cap refill less than 2 seconds.  No obvious erythema but some warmth.  Skin:    Coloration: Skin is not pale.     Findings: No rash.  Neurological:     Mental Status: He is alert and oriented to person, place, and time.  Psychiatric:        Behavior: Behavior normal.     (all labs ordered are listed, but only abnormal results are displayed) Labs Reviewed - No data to display  EKG: None  Radiology: DG Finger Middle Right Result Date: 12/16/2023 EXAM: 3 VIEW(S) XRAY OF THE RIGHT FINGER(S) 12/16/2023 04:42:00 PM COMPARISON: None available. CLINICAL HISTORY: Right middle finger pain, swelling, and redness since yesterday. Denies injury. FINDINGS: BONES AND JOINTS: No acute fracture. No joint dislocation. Degenerative changes of the proximal and distal interphalangeal joints with joint space narrowing and osteophyte formation. Similar degenerative changes are noted in the visualized index distal interphalangeal joint and the proximal interphalangeal joints of the index and ring fingers. No definite erosions. SOFT TISSUES: Marked soft tissue swelling surrounding the proximal interphalangeal joint of the third digit. IMPRESSION: 1. Marked soft tissue swelling surrounding the proximal interphalangeal joint of the middle finger. 2. Degenerative osteoarthritis involving proximal and distal interphalangeal joints with joint space narrowing and osteophyte formation. No definite erosions. 3. Similar degenerative changes in the index DIP and PIP joints and ring finger PIP joint. Electronically signed by: Dayne Hassell MD 12/16/2023 04:50 PM EDT RP Workstation: [id number]     Procedures   Medications Ordered in the ED - No data to display                                  Medical Decision Making Amount and/or Complexity of Data Reviewed Radiology:  ordered.  Risk Prescription drug management.   68 yo M with a chief complaints of right middle finger pain and swelling.  This has been a problem for him off and on.  He tells me this is the third time that has happened.  Swelling and pain seems localized to the right third PIP.  I think this is unlikely to be septic arthritis by history.  Perhaps gout though not  a typical joints and so I do not feel confident in performing an arthrocentesis here.  On record review the patient just had blood work and had a drop in his hemoglobin from about a year ago.  He denies dark stool or blood in his stool.  Was documented as tachycardic on arrival although not tachycardic on my manual count in the room.  Will obtain a plain film of the finger.  Likely course of steroids antibiotics and hand follow-up.  Plain film of the right third digit independently interpreted by me without fracture or dislocation.  5:00 PM:  I have discussed the diagnosis/risks/treatment options with the patient and family.  Evaluation and diagnostic testing in the emergency department does not suggest an emergent condition requiring admission or immediate intervention beyond what has been performed at this time.  They will follow up with PCP, Hand. We also discussed returning to the ED immediately if new or worsening sx occur. We discussed the sx which are most concerning (e.g., sudden worsening pain, fever, inability to tolerate by mouth, redness, rapid spreading) that necessitate immediate return. Medications administered to the patient during their visit and any new prescriptions provided to the patient are listed below.  Medications given during this visit Medications - No data to display   The patient appears reasonably screen and/or stabilized for discharge and I doubt any other medical condition or other [facility] requiring further screening, evaluation, or treatment in the ED at this time prior to discharge.       Final  diagnoses:  Finger swelling    ED Discharge Orders          Ordered    methylPREDNISolone (MEDROL DOSEPAK) 4 MG TBPK tablet        12/16/23 1657    cephALEXin (KEFLEX) 500 MG capsule  4 times daily        12/16/23 1657    diclofenac  Sodium (VOLTAREN ) 1 % GEL  4 times daily        12/16/23 1658               Pierre Dellarocco, DO 12/16/23 1700

## 2023-12-19 ENCOUNTER — Telehealth: Payer: Self-pay | Admitting: Family

## 2023-12-19 DIAGNOSIS — D649 Anemia, unspecified: Secondary | ICD-10-CM

## 2023-12-19 NOTE — Telephone Encounter (Signed)
 Spoke with patient and he stated that he will get it here this week.

## 2023-12-19 NOTE — Telephone Encounter (Signed)
 Spoke to patient.  Advised him that he is very anemic.  He has appointment with hematology on 10/21.  He is advised to complete and return the IFOB asap and agrees to do so.  Discussed that if he develops weakness/sob to go to the ER as it could indicate worsening anemia.    He states that he was seen in the ED for finger swelling over the weekend and his finger is improving.

## 2023-12-20 ENCOUNTER — Other Ambulatory Visit

## 2023-12-20 ENCOUNTER — Inpatient Hospital Stay: Admitting: Medical Oncology

## 2023-12-20 DIAGNOSIS — D649 Anemia, unspecified: Secondary | ICD-10-CM | POA: Diagnosis not present

## 2023-12-21 LAB — FECAL OCCULT BLOOD, IMMUNOCHEMICAL: Fecal Occult Bld: NEGATIVE

## 2023-12-27 ENCOUNTER — Inpatient Hospital Stay: Attending: Medical Oncology | Admitting: Medical Oncology

## 2023-12-27 ENCOUNTER — Inpatient Hospital Stay

## 2023-12-27 DIAGNOSIS — R944 Abnormal results of kidney function studies: Secondary | ICD-10-CM | POA: Insufficient documentation

## 2023-12-27 DIAGNOSIS — Z79899 Other long term (current) drug therapy: Secondary | ICD-10-CM | POA: Insufficient documentation

## 2023-12-27 DIAGNOSIS — D509 Iron deficiency anemia, unspecified: Secondary | ICD-10-CM | POA: Insufficient documentation

## 2024-01-04 ENCOUNTER — Inpatient Hospital Stay

## 2024-01-04 ENCOUNTER — Encounter: Payer: Self-pay | Admitting: Medical Oncology

## 2024-01-04 ENCOUNTER — Inpatient Hospital Stay: Admitting: Medical Oncology

## 2024-01-04 VITALS — BP 145/73 | HR 95 | Temp 98.2°F | Resp 16 | Ht 69.0 in | Wt 250.0 lb

## 2024-01-04 DIAGNOSIS — E611 Iron deficiency: Secondary | ICD-10-CM

## 2024-01-04 DIAGNOSIS — D649 Anemia, unspecified: Secondary | ICD-10-CM

## 2024-01-04 DIAGNOSIS — R7989 Other specified abnormal findings of blood chemistry: Secondary | ICD-10-CM

## 2024-01-04 DIAGNOSIS — Z79899 Other long term (current) drug therapy: Secondary | ICD-10-CM | POA: Diagnosis not present

## 2024-01-04 DIAGNOSIS — D509 Iron deficiency anemia, unspecified: Secondary | ICD-10-CM

## 2024-01-04 DIAGNOSIS — R944 Abnormal results of kidney function studies: Secondary | ICD-10-CM

## 2024-01-04 LAB — CBC WITH DIFFERENTIAL (CANCER CENTER ONLY)
Abs Immature Granulocytes: 0.06 K/uL (ref 0.00–0.07)
Basophils Absolute: 0 K/uL (ref 0.0–0.1)
Basophils Relative: 1 %
Eosinophils Absolute: 0.1 K/uL (ref 0.0–0.5)
Eosinophils Relative: 3 %
HCT: 27.4 % — ABNORMAL LOW (ref 39.0–52.0)
Hemoglobin: 8.7 g/dL — ABNORMAL LOW (ref 13.0–17.0)
Immature Granulocytes: 1 %
Lymphocytes Relative: 26 %
Lymphs Abs: 1.3 K/uL (ref 0.7–4.0)
MCH: 30.9 pg (ref 26.0–34.0)
MCHC: 31.8 g/dL (ref 30.0–36.0)
MCV: 97.2 fL (ref 80.0–100.0)
Monocytes Absolute: 0.5 K/uL (ref 0.1–1.0)
Monocytes Relative: 10 %
Neutro Abs: 3 K/uL (ref 1.7–7.7)
Neutrophils Relative %: 59 %
Platelet Count: 322 K/uL (ref 150–400)
RBC: 2.82 MIL/uL — ABNORMAL LOW (ref 4.22–5.81)
RDW: 14.7 % (ref 11.5–15.5)
WBC Count: 4.9 K/uL (ref 4.0–10.5)
nRBC: 0 % (ref 0.0–0.2)

## 2024-01-04 LAB — RETIC PANEL
Immature Retic Fract: 20.5 % — ABNORMAL HIGH (ref 2.3–15.9)
RBC.: 2.81 MIL/uL — ABNORMAL LOW (ref 4.22–5.81)
Retic Count, Absolute: 37.9 K/uL (ref 19.0–186.0)
Retic Ct Pct: 1.4 % (ref 0.4–3.1)
Reticulocyte Hemoglobin: 35.6 pg (ref >27.9)

## 2024-01-04 LAB — IRON AND IRON BINDING CAPACITY (CC-WL,HP ONLY)
Iron: 63 ug/dL (ref 45–182)
Saturation Ratios: 14 % — ABNORMAL LOW (ref 17.9–39.5)
TIBC: 444 ug/dL (ref 250–450)
UIBC: 381 ug/dL

## 2024-01-04 LAB — SAMPLE TO BLOOD BANK

## 2024-01-04 LAB — LACTATE DEHYDROGENASE: LDH: 266 U/L — ABNORMAL HIGH (ref 98–192)

## 2024-01-04 LAB — FERRITIN: Ferritin: 687 ng/mL — ABNORMAL HIGH (ref 24–336)

## 2024-01-04 LAB — SAVE SMEAR(SSMR), FOR PROVIDER SLIDE REVIEW
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
## 2024-01-05 LAB — ERYTHROPOIETIN: Erythropoietin: 15.6 m[IU]/mL (ref 2.6–18.5)

## 2024-01-05 LAB — KAPPA/LAMBDA LIGHT CHAINS
Kappa free light chain: 54.4 mg/L — ABNORMAL HIGH (ref 3.3–19.4)
Kappa, lambda light chain ratio: 1.04 (ref 0.26–1.65)
Lambda free light chains: 52.1 mg/L — ABNORMAL HIGH (ref 5.7–26.3)

## 2024-01-06 ENCOUNTER — Ambulatory Visit (INDEPENDENT_AMBULATORY_CARE_PROVIDER_SITE_OTHER): Admitting: Family

## 2024-01-06 VITALS — BP 134/80 | HR 106 | Temp 98.4°F | Resp 16 | Ht 69.0 in | Wt 266.0 lb

## 2024-01-06 DIAGNOSIS — N289 Disorder of kidney and ureter, unspecified: Secondary | ICD-10-CM | POA: Diagnosis not present

## 2024-01-06 DIAGNOSIS — E118 Type 2 diabetes mellitus with unspecified complications: Secondary | ICD-10-CM | POA: Diagnosis not present

## 2024-01-06 DIAGNOSIS — E1142 Type 2 diabetes mellitus with diabetic polyneuropathy: Secondary | ICD-10-CM

## 2024-01-06 DIAGNOSIS — I10 Essential (primary) hypertension: Secondary | ICD-10-CM | POA: Diagnosis not present

## 2024-01-06 DIAGNOSIS — E785 Hyperlipidemia, unspecified: Secondary | ICD-10-CM | POA: Diagnosis not present

## 2024-01-06 DIAGNOSIS — D649 Anemia, unspecified: Secondary | ICD-10-CM

## 2024-01-06 LAB — BASIC METABOLIC PANEL WITH GFR
BUN: 33 mg/dL — ABNORMAL HIGH (ref 6–23)
CO2: 22 meq/L (ref 19–32)
Calcium: 9.4 mg/dL (ref 8.4–10.5)
Chloride: 107 meq/L (ref 96–112)
Creatinine, Ser: 1.78 mg/dL — ABNORMAL HIGH (ref 0.40–1.50)
GFR: 39.19 mL/min — ABNORMAL LOW (ref >60.00)
Glucose, Bld: 122 mg/dL — ABNORMAL HIGH (ref 70–99)
Potassium: 4.3 meq/L (ref 3.5–5.1)
Sodium: 140 meq/L (ref 135–145)

## 2024-01-06 LAB — HEMOGLOBIN A1C: Hgb A1c MFr Bld: 6.6 % — ABNORMAL HIGH (ref 4.6–6.5)

## 2024-01-06 LAB — MICROALBUMIN / CREATININE URINE RATIO
Creatinine,U: 115.7 mg/dL
Microalb Creat Ratio: 420.9 mg/g — ABNORMAL HIGH (ref 0.0–30.0)
Microalb, Ur: 48.7 mg/dL — ABNORMAL HIGH (ref 0.0–1.9)

## 2024-01-06 MED ORDER — FENOFIBRATE 145 MG PO TABS: ORAL_TABLET | ORAL | 1 refills | Status: AC

## 2024-01-06 NOTE — Assessment & Plan Note (Signed)
 Lab Results  Component Value Date   CHOL 139 12/06/2023   HDL 53.30 12/06/2023   LDLCALC 71 12/06/2023   LDLDIRECT 116.0 09/15/2021   TRIG 74.0 12/06/2023   CHOLHDL 3 12/06/2023  LDL 71- nearly at goal. Continue crestor .

## 2024-01-06 NOTE — Assessment & Plan Note (Signed)
 Work up ongoing with hematology- IFOB negative. Pt is currently being evaluated for possible multiple myeloma.

## 2024-01-06 NOTE — Assessment & Plan Note (Signed)
 Bp stable on amlodipine  and losartan , continue same.
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
## 2024-01-06 NOTE — Assessment & Plan Note (Signed)
 This is not currently causing him any issues.

## 2024-01-06 NOTE — Patient Instructions (Signed)
 VISIT SUMMARY:  You had a follow-up visit to discuss your anemia and other health conditions. Your hemoglobin level is currently 8.7, and you are continuing with iron supplements. We also reviewed your kidney function, diabetes, blood pressure, and cholesterol levels.  YOUR PLAN:  ANEMIA: Your hemoglobin level is 8.7, and we are continuing to evaluate the cause, possibly related to your kidney function. -Continue with your iron supplements, aiming for 325 mg if possible. -Check with your pharmacy for the availability of higher dose iron supplements. -Continue with the hematology evaluation.  CHRONIC KIDNEY DISEASE, STAGE 3: Your kidney function has decreased, with a GFR of 44. This may be linked to your anemia. -We will recheck your kidney function test today.  TYPE 2 DIABETES MELLITUS WITH DIABETIC POLYNEUROPATHY: Your diabetes management is ongoing, and you have no current neuropathy pain. You have dry, scaly skin on your feet, likely related to diabetes. -We performed a foot exam today. -Soak your feet, exfoliate, and use Lotrimin cream or spray for the dry, scaly skin.  ESSENTIAL HYPERTENSION: Your blood pressure is well-controlled at 134/80 with your current medications. -Continue taking amlodipine  10 mg and losartan  100 mg. -We provided refills for your medications.  HYPERLIPIDEMIA: Your cholesterol levels are well-controlled with Crestor . -Continue taking Crestor  20 mg.  GENERAL HEALTH MAINTENANCE: You received a COVID booster recently. We discussed the hepatitis B vaccination, which is not indicated unless you are at high risk. -Consider getting the hepatitis B vaccination if you are at high risk.

## 2024-01-06 NOTE — Assessment & Plan Note (Addendum)
 Lab Results  Component Value Date   HGBA1C 6.5 12/14/2022   HGBA1C 5.8 01/01/2022   HGBA1C 6.1 09/15/2021   Lab Results  Component Value Date   MICROALBUR 69.5 (H) 12/06/2023   LDLCALC 71 12/06/2023   CREATININE 1.60 (H) 12/06/2023   This has been diet controlled.

## 2024-01-10 ENCOUNTER — Ambulatory Visit: Payer: Self-pay

## 2024-01-10 LAB — MULTIPLE MYELOMA PANEL, SERUM
Albumin SerPl Elph-Mcnc: 3.6 g/dL (ref 2.9–4.4)
Albumin/Glob SerPl: 0.9 (ref 0.7–1.7)
Alpha 1: 0.4 g/dL (ref 0.0–0.4)
Alpha2 Glob SerPl Elph-Mcnc: 1.4 g/dL — ABNORMAL HIGH (ref 0.4–1.0)
B-Globulin SerPl Elph-Mcnc: 1.6 g/dL — ABNORMAL HIGH (ref 0.7–1.3)
Gamma Glob SerPl Elph-Mcnc: 1 g/dL (ref 0.4–1.8)
Globulin, Total: 4.3 g/dL — ABNORMAL HIGH (ref 2.2–3.9)
IgA: 356 mg/dL (ref 61–437)
IgG (Immunoglobin G), Serum: 1089 mg/dL (ref 603–1613)
IgM (Immunoglobulin M), Srm: 53 mg/dL (ref 20–172)
Total Protein ELP: 7.9 g/dL (ref 6.0–8.5)

## 2024-01-16 ENCOUNTER — Other Ambulatory Visit: Payer: Self-pay | Admitting: Nephrology

## 2024-01-16 DIAGNOSIS — N1831 Chronic kidney disease, stage 3a: Secondary | ICD-10-CM

## 2024-01-16 LAB — LAB REPORT - SCANNED
Albumin, Urine POC: 600.7
HM HIV Screening: NEGATIVE
HM Hepatitis Screen: NEGATIVE
Microalb Creat Ratio: 345

## 2024-01-23 ENCOUNTER — Ambulatory Visit
Admission: RE | Admit: 2024-01-23 | Discharge: 2024-01-23 | Disposition: A | Source: Ambulatory Visit | Attending: Nephrology | Admitting: Nephrology

## 2024-01-23 DIAGNOSIS — N1831 Chronic kidney disease, stage 3a: Secondary | ICD-10-CM

## 2024-01-25 ENCOUNTER — Inpatient Hospital Stay: Attending: Medical Oncology

## 2024-01-25 DIAGNOSIS — D509 Iron deficiency anemia, unspecified: Secondary | ICD-10-CM | POA: Insufficient documentation

## 2024-01-25 DIAGNOSIS — D649 Anemia, unspecified: Secondary | ICD-10-CM

## 2024-01-31 LAB — UPEP/UIFE/LIGHT CHAINS/TP, 24-HR UR
% BETA, Urine: 13.4 %
ALPHA 1 URINE: 3.9 %
Albumin, U: 69.2 %
Alpha 2, Urine: 6.3 %
Free Kappa Lt Chains,Ur: 66.34 mg/L (ref 1.17–86.46)
Free Kappa/Lambda Ratio: 4.3 (ref 1.83–14.26)
Free Lambda Lt Chains,Ur: 15.43 mg/L — ABNORMAL HIGH (ref 0.27–15.21)
GAMMA GLOBULIN URINE: 7.3 %
Total Protein, Urine-Ur/day: 289 mg/(24.h) — ABNORMAL HIGH (ref 30–150)
Total Protein, Urine: 28.9 mg/dL
Total Volume: 1000

## 2024-02-01 ENCOUNTER — Ambulatory Visit: Payer: Self-pay | Admitting: Medical Oncology

## 2024-02-01 ENCOUNTER — Other Ambulatory Visit: Payer: Self-pay | Admitting: Medical Oncology

## 2024-02-01 DIAGNOSIS — D649 Anemia, unspecified: Secondary | ICD-10-CM

## 2024-02-01 DIAGNOSIS — E611 Iron deficiency: Secondary | ICD-10-CM

## 2024-02-06 ENCOUNTER — Inpatient Hospital Stay

## 2024-02-06 ENCOUNTER — Inpatient Hospital Stay: Admitting: Medical Oncology

## 2024-02-16 ENCOUNTER — Inpatient Hospital Stay: Admitting: Medical Oncology

## 2024-02-16 ENCOUNTER — Inpatient Hospital Stay: Attending: Medical Oncology

## 2024-02-16 ENCOUNTER — Encounter: Payer: Self-pay | Admitting: Medical Oncology

## 2024-02-16 VITALS — BP 152/78 | HR 98 | Temp 97.8°F | Resp 20 | Ht 69.0 in | Wt 268.8 lb

## 2024-02-16 DIAGNOSIS — N189 Chronic kidney disease, unspecified: Secondary | ICD-10-CM | POA: Diagnosis not present

## 2024-02-16 DIAGNOSIS — D509 Iron deficiency anemia, unspecified: Secondary | ICD-10-CM | POA: Insufficient documentation

## 2024-02-16 DIAGNOSIS — R7989 Other specified abnormal findings of blood chemistry: Secondary | ICD-10-CM

## 2024-02-16 DIAGNOSIS — E611 Iron deficiency: Secondary | ICD-10-CM

## 2024-02-16 DIAGNOSIS — D649 Anemia, unspecified: Secondary | ICD-10-CM | POA: Diagnosis not present

## 2024-02-16 DIAGNOSIS — Z79899 Other long term (current) drug therapy: Secondary | ICD-10-CM | POA: Diagnosis not present

## 2024-02-16 LAB — CBC WITH DIFFERENTIAL (CANCER CENTER ONLY)
Abs Immature Granulocytes: 0.02 K/uL (ref 0.00–0.07)
Basophils Absolute: 0 K/uL (ref 0.0–0.1)
Basophils Relative: 1 %
Eosinophils Absolute: 0.2 K/uL (ref 0.0–0.5)
Eosinophils Relative: 3 %
HCT: 27.7 % — ABNORMAL LOW (ref 39.0–52.0)
Hemoglobin: 9 g/dL — ABNORMAL LOW (ref 13.0–17.0)
Immature Granulocytes: 0 %
Lymphocytes Relative: 29 %
Lymphs Abs: 1.3 K/uL (ref 0.7–4.0)
MCH: 30.9 pg (ref 26.0–34.0)
MCHC: 32.5 g/dL (ref 30.0–36.0)
MCV: 95.2 fL (ref 80.0–100.0)
Monocytes Absolute: 0.7 K/uL (ref 0.1–1.0)
Monocytes Relative: 16 %
Neutro Abs: 2.3 K/uL (ref 1.7–7.7)
Neutrophils Relative %: 51 %
Platelet Count: 208 K/uL (ref 150–400)
RBC: 2.91 MIL/uL — ABNORMAL LOW (ref 4.22–5.81)
RDW: 16.4 % — ABNORMAL HIGH (ref 11.5–15.5)
WBC Count: 4.5 K/uL (ref 4.0–10.5)
nRBC: 0 % (ref 0.0–0.2)

## 2024-02-16 LAB — VITAMIN B12: Vitamin B-12: 1035 pg/mL — ABNORMAL HIGH (ref 180–914)

## 2024-02-16 LAB — IRON AND IRON BINDING CAPACITY (CC-WL,HP ONLY)
Iron: 75 ug/dL (ref 45–182)
Saturation Ratios: 21 % (ref 17.9–39.5)
TIBC: 354 ug/dL (ref 250–450)
UIBC: 279 ug/dL

## 2024-02-16 LAB — CMP (CANCER CENTER ONLY)
ALT: 13 U/L (ref 0–44)
AST: 29 U/L (ref 15–41)
Albumin: 4.5 g/dL (ref 3.5–5.0)
Alkaline Phosphatase: 64 U/L (ref 38–126)
Anion gap: 17 — ABNORMAL HIGH (ref 5–15)
BUN: 25 mg/dL — ABNORMAL HIGH (ref 8–23)
CO2: 20 mmol/L — ABNORMAL LOW (ref 22–32)
Calcium: 10 mg/dL (ref 8.9–10.3)
Chloride: 102 mmol/L (ref 98–111)
Creatinine: 1.35 mg/dL — ABNORMAL HIGH (ref 0.61–1.24)
GFR, Estimated: 58 mL/min — ABNORMAL LOW (ref >60)
Glucose, Bld: 130 mg/dL — ABNORMAL HIGH (ref 70–99)
Potassium: 4.2 mmol/L (ref 3.5–5.1)
Sodium: 138 mmol/L (ref 135–145)
Total Bilirubin: 0.7 mg/dL (ref 0.0–1.2)
Total Protein: 8.2 g/dL — ABNORMAL HIGH (ref 6.5–8.1)

## 2024-02-16 LAB — FERRITIN: Ferritin: 344 ng/mL — ABNORMAL HIGH (ref 24–336)

## 2024-02-16 LAB — RETIC PANEL
Immature Retic Fract: 32.3 % — ABNORMAL HIGH (ref 2.3–15.9)
RBC.: 2.87 MIL/uL — ABNORMAL LOW (ref 4.22–5.81)
Retic Count, Absolute: 71.2 K/uL (ref 19.0–186.0)
Retic Ct Pct: 2.5 % (ref 0.4–3.1)
Reticulocyte Hemoglobin: 38 pg (ref >27.9)
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
## 2024-02-18 LAB — ERYTHROPOIETIN: Erythropoietin: 15.9 m[IU]/mL (ref 2.6–18.5)

## 2024-02-20 ENCOUNTER — Ambulatory Visit: Payer: Self-pay | Admitting: Medical Oncology

## 2024-03-03 ENCOUNTER — Other Ambulatory Visit: Payer: Self-pay | Admitting: Family

## 2024-03-03 DIAGNOSIS — J029 Acute pharyngitis, unspecified: Secondary | ICD-10-CM

## 2024-03-14 ENCOUNTER — Ambulatory Visit: Admitting: Family

## 2024-04-05 ENCOUNTER — Inpatient Hospital Stay

## 2024-04-05 ENCOUNTER — Inpatient Hospital Stay: Admitting: Medical Oncology

## 2024-04-10 ENCOUNTER — Ambulatory Visit: Admitting: Family
